# Patient Record
Sex: Male | Born: 1960
Health system: Southern US, Community
[De-identification: ages and names within clinical notes are randomized; demographics above are authoritative.]

## PROBLEM LIST (undated history)

## (undated) DIAGNOSIS — E7800 Pure hypercholesterolemia, unspecified: Secondary | ICD-10-CM

## (undated) DIAGNOSIS — I10 Essential (primary) hypertension: Secondary | ICD-10-CM

## (undated) HISTORY — DX: Pure hypercholesterolemia, unspecified: E78.00

---

## 2005-08-14 ENCOUNTER — Ambulatory Visit (HOSPITAL_COMMUNITY): Admission: RE | Admit: 2005-08-14 | Discharge: 2005-08-14 | Payer: Self-pay | Admitting: Family Medicine

## 2008-03-19 HISTORY — PX: COLONOSCOPY: SHX174

## 2008-09-27 ENCOUNTER — Encounter (INDEPENDENT_AMBULATORY_CARE_PROVIDER_SITE_OTHER): Payer: Self-pay | Admitting: *Deleted

## 2008-11-17 ENCOUNTER — Ambulatory Visit: Payer: Self-pay | Admitting: Internal Medicine

## 2008-11-17 DIAGNOSIS — K921 Melena: Secondary | ICD-10-CM | POA: Insufficient documentation

## 2008-11-17 DIAGNOSIS — K219 Gastro-esophageal reflux disease without esophagitis: Secondary | ICD-10-CM | POA: Insufficient documentation

## 2008-11-18 ENCOUNTER — Encounter: Payer: Self-pay | Admitting: Internal Medicine

## 2008-11-19 ENCOUNTER — Encounter: Payer: Self-pay | Admitting: Internal Medicine

## 2008-12-03 ENCOUNTER — Encounter: Payer: Self-pay | Admitting: Internal Medicine

## 2008-12-03 ENCOUNTER — Ambulatory Visit: Payer: Self-pay | Admitting: Internal Medicine

## 2008-12-03 ENCOUNTER — Ambulatory Visit (HOSPITAL_COMMUNITY): Admission: RE | Admit: 2008-12-03 | Discharge: 2008-12-03 | Payer: Self-pay | Admitting: Internal Medicine

## 2008-12-07 ENCOUNTER — Encounter: Payer: Self-pay | Admitting: Internal Medicine

## 2010-06-23 LAB — CBC
HCT: 42.9 % (ref 39.0–52.0)
MCV: 99.6 fL (ref 78.0–100.0)
RBC: 4.31 MIL/uL (ref 4.22–5.81)
WBC: 6.8 10*3/uL (ref 4.0–10.5)

## 2010-06-23 LAB — DIFFERENTIAL
Eosinophils Absolute: 0.2 10*3/uL (ref 0.0–0.7)
Eosinophils Relative: 3 % (ref 0–5)
Lymphocytes Relative: 20 % (ref 12–46)
Lymphs Abs: 1.4 10*3/uL (ref 0.7–4.0)
Monocytes Relative: 8 % (ref 3–12)
Neutrophils Relative %: 69 % (ref 43–77)

## 2010-08-04 NOTE — Procedures (Signed)
NAME:  Kyle Lane, Kyle Lane          ACCOUNT NO.:  [id number]   MEDICAL RECORD NO.:  [id number]          PATIENT TYPE:  OUT   LOCATION:  [facility]                          FACILITY:  [facility]   PHYSICIAN:  Donna Bernard, M.D.DATE OF BIRTH:  01/25/1961   DATE OF PROCEDURE:  08/14/2005  DATE OF DISCHARGE:  08/14/2005                                    STRESS TEST   INDICATION FOR TEST:  This patient is a 50 year old African-American male  with recent atypical chest pains. Stress-test is done for risk  stratification purposes.   RESTING EKG:  Resting EKG reveals normal sinus rhythm with no significant  ST, T changes. The patient tolerated the first several stages well, in fact  he made it all the way up to the IV stage and reached maximum heart rate of  162.  At this point he had exceeded his sub-max predicted heart rate of 149.  At 0.08 seconds past the J-point there were no significant ST, T changes.  The patient stopped the test on the basis of fatigue.  The post-exercise EKG  was within normal limits.  The patient had hypertensive responses expected.   IMPRESSION:  Negative adequate stress-test.   PLAN:  The patient is encouraged to get back on a regular exercise program.      W. Simone Curia, M.D.  Electronically Signed     WSL/MEDQ  D:  09/14/2005  T:  09/14/2005  Job:  [id number]

## 2012-12-03 ENCOUNTER — Ambulatory Visit (INDEPENDENT_AMBULATORY_CARE_PROVIDER_SITE_OTHER): Payer: BC Managed Care – PPO | Admitting: Family Medicine

## 2012-12-03 ENCOUNTER — Encounter: Payer: Self-pay | Admitting: Family Medicine

## 2012-12-03 VITALS — BP 110/88 | Ht 65.0 in | Wt 187.1 lb

## 2012-12-03 DIAGNOSIS — I10 Essential (primary) hypertension: Secondary | ICD-10-CM

## 2012-12-03 DIAGNOSIS — E785 Hyperlipidemia, unspecified: Secondary | ICD-10-CM

## 2012-12-03 DIAGNOSIS — Z79899 Other long term (current) drug therapy: Secondary | ICD-10-CM

## 2012-12-03 DIAGNOSIS — R7301 Impaired fasting glucose: Secondary | ICD-10-CM

## 2012-12-03 MED ORDER — SIMVASTATIN 20 MG PO TABS: 20.0000 mg | ORAL_TABLET | Freq: Every evening | ORAL | Status: DC

## 2012-12-03 MED ORDER — MOEXIPRIL-HYDROCHLOROTHIAZIDE 15-12.5 MG PO TABS: ORAL_TABLET | ORAL | Status: DC

## 2012-12-03 NOTE — Progress Notes (Signed)
  Subjective:    Patient ID: Kyle Lane, male    DOB: [birth year], 52 y.o.   MRN: [id number]  Hypertension This is a chronic problem. The current episode started more than 1 year ago. The problem has been gradually improving since onset. The problem is controlled. There are no associated agents to hypertension. There are no known risk factors for coronary artery disease. The current treatment provides significant improvement. There are no compliance problems.   Patient states that he has no concerns today.  Needs to work on exercise, bowls once per wk Tingling sendstion Diet overall not too bad  Chol med faithful compliance, Review of Systems No chest pain no abdominal pain no change in bowel habits ROS otherwise negative    Objective:   Physical Exam  Alert hydration good. HEENT normal. Lungs clear. Heart regular rate and rhythm. Ankles without edema.      Assessment & Plan:  Impression #1 hypertension good control. #2 hyperlipidemia exact status uncertain. Plan diet exercise discussed. Appropriate blood work. Maintain same meds. Check every 6 months. WSL

## 2012-12-09 DIAGNOSIS — I10 Essential (primary) hypertension: Secondary | ICD-10-CM | POA: Insufficient documentation

## 2012-12-09 DIAGNOSIS — E785 Hyperlipidemia, unspecified: Secondary | ICD-10-CM | POA: Insufficient documentation

## 2012-12-09 LAB — LIPID PANEL
LDL Cholesterol: 107 mg/dL — ABNORMAL HIGH (ref 0–99)
Total CHOL/HDL Ratio: 3.1 Ratio
Triglycerides: 53 mg/dL (ref <150)
VLDL: 11 mg/dL (ref 0–40)

## 2012-12-09 LAB — HEPATIC FUNCTION PANEL
ALT: 26 U/L (ref 0–53)
Albumin: 4.5 g/dL (ref 3.5–5.2)
Indirect Bilirubin: 0.5 mg/dL (ref 0.0–0.9)
Total Protein: 7.4 g/dL (ref 6.0–8.3)

## 2012-12-09 LAB — GLUCOSE, RANDOM: Glucose, Bld: 96 mg/dL (ref 70–99)

## 2012-12-11 ENCOUNTER — Encounter: Payer: Self-pay | Admitting: Family Medicine

## 2013-07-03 ENCOUNTER — Ambulatory Visit: Payer: BC Managed Care – PPO | Admitting: Family Medicine

## 2013-07-06 ENCOUNTER — Ambulatory Visit (INDEPENDENT_AMBULATORY_CARE_PROVIDER_SITE_OTHER): Payer: BC Managed Care – PPO | Admitting: Family Medicine

## 2013-07-06 ENCOUNTER — Encounter: Payer: Self-pay | Admitting: Family Medicine

## 2013-07-06 VITALS — BP 154/98 | Ht 66.0 in | Wt 190.0 lb

## 2013-07-06 DIAGNOSIS — I10 Essential (primary) hypertension: Secondary | ICD-10-CM

## 2013-07-06 DIAGNOSIS — Z79899 Other long term (current) drug therapy: Secondary | ICD-10-CM

## 2013-07-06 DIAGNOSIS — Z125 Encounter for screening for malignant neoplasm of prostate: Secondary | ICD-10-CM

## 2013-07-06 DIAGNOSIS — E785 Hyperlipidemia, unspecified: Secondary | ICD-10-CM

## 2013-07-06 MED ORDER — MOEXIPRIL-HYDROCHLOROTHIAZIDE 15-12.5 MG PO TABS: ORAL_TABLET | ORAL | Status: DC

## 2013-07-06 MED ORDER — SIMVASTATIN 20 MG PO TABS: 20.0000 mg | ORAL_TABLET | Freq: Every evening | ORAL | Status: DC

## 2013-07-06 NOTE — Progress Notes (Signed)
   Subjective:    Patient ID: Kyle Lane, male    DOB: [birth year], 53 y.o.   MRN: [id number]  HPI Patient is here today for a 6 month check up.  Pt needs a refill on his meds.  He has no concerns.   Sticks with meds well  No obvious s e's, trying to watch the diet but not abad  Ran out of med Tuesday  Exercising some but not as much as he had hoped  Review of Systems No headache no chest pain no back pain no abdominal pain no change in bowel habits    Objective:   Physical Exam Alert no acute distress vitals stable blood pressure still elevated 1 [id number] lungs clear. Heart rare in rhythm.       Assessment & Plan:  Impression 1 hypertension suboptimal in control with recent noncompliance. #2 hyperlipidemia status uncertain plan appropriate blood work. Diet exercise discussed. Meds refilled. Recheck in 6 months. WSL

## 2013-07-24 LAB — BASIC METABOLIC PANEL
BUN: 16 mg/dL (ref 6–23)
CHLORIDE: 102 meq/L (ref 96–112)
CO2: 27 mEq/L (ref 19–32)
Calcium: 9.3 mg/dL (ref 8.4–10.5)
Creat: 0.92 mg/dL (ref 0.50–1.35)
Glucose, Bld: 103 mg/dL — ABNORMAL HIGH (ref 70–99)
POTASSIUM: 4.7 meq/L (ref 3.5–5.3)
SODIUM: 139 meq/L (ref 135–145)

## 2013-07-24 LAB — LIPID PANEL
Cholesterol: 178 mg/dL (ref 0–200)
HDL: 46 mg/dL (ref >39)
LDL CALC: 104 mg/dL — AB (ref 0–99)
Total CHOL/HDL Ratio: 3.9 Ratio
Triglycerides: 140 mg/dL (ref <150)
VLDL: 28 mg/dL (ref 0–40)

## 2013-07-24 LAB — HEPATIC FUNCTION PANEL
ALK PHOS: 69 U/L (ref 39–117)
ALT: 17 U/L (ref 0–53)
AST: 20 U/L (ref 0–37)
Albumin: 4.3 g/dL (ref 3.5–5.2)
BILIRUBIN INDIRECT: 0.7 mg/dL (ref 0.2–1.2)
Bilirubin, Direct: 0.1 mg/dL (ref 0.0–0.3)
TOTAL PROTEIN: 7.1 g/dL (ref 6.0–8.3)
Total Bilirubin: 0.8 mg/dL (ref 0.2–1.2)

## 2013-07-24 LAB — PSA: PSA: 0.47 ng/mL (ref <=4.00)

## 2013-08-02 ENCOUNTER — Encounter: Payer: Self-pay | Admitting: Family Medicine

## 2013-09-02 ENCOUNTER — Ambulatory Visit (INDEPENDENT_AMBULATORY_CARE_PROVIDER_SITE_OTHER): Payer: BC Managed Care – PPO | Admitting: Family Medicine

## 2013-09-02 ENCOUNTER — Encounter: Payer: Self-pay | Admitting: Family Medicine

## 2013-09-02 ENCOUNTER — Ambulatory Visit (HOSPITAL_COMMUNITY)
Admission: RE | Admit: 2013-09-02 | Discharge: 2013-09-02 | Disposition: A | Discharge status: Home / Self Care | Payer: Federal, State, Local not specified - PPO | Source: Ambulatory Visit | Attending: Family Medicine | Admitting: Family Medicine

## 2013-09-02 ENCOUNTER — Other Ambulatory Visit: Payer: Self-pay

## 2013-09-02 VITALS — BP 168/98 | Ht 66.0 in | Wt 186.0 lb

## 2013-09-02 DIAGNOSIS — M1990 Unspecified osteoarthritis, unspecified site: Secondary | ICD-10-CM

## 2013-09-02 DIAGNOSIS — M129 Arthropathy, unspecified: Secondary | ICD-10-CM

## 2013-09-02 DIAGNOSIS — M25532 Pain in left wrist: Secondary | ICD-10-CM

## 2013-09-02 DIAGNOSIS — M25539 Pain in unspecified wrist: Secondary | ICD-10-CM | POA: Insufficient documentation

## 2013-09-02 MED ORDER — HYDROCODONE-ACETAMINOPHEN 5-325 MG PO TABS: 1.0000 | ORAL_TABLET | Freq: Four times a day (QID) | ORAL | Status: DC | PRN

## 2013-09-02 MED ORDER — PREDNISONE 20 MG PO TABS: ORAL_TABLET | ORAL | Status: DC

## 2013-09-02 NOTE — Progress Notes (Signed)
   Subjective:    Patient ID: Kyle Lane, male    DOB: 01-19-1961, 53 y.o.   MRN: [id number]  Wrist Pain  The pain is present in the left wrist. This is a new problem. Episode onset: Orginally fractured it 20+ years. Started hurting again Monday. There has been a history of trauma. The problem occurs constantly. The problem has been gradually worsening. The quality of the pain is described as sharp. The pain is moderate. Associated symptoms include a limited range of motion. The symptoms are aggravated by activity. He has tried OTC ointments and cold for the symptoms. The treatment provided mild relief.   Pt had an xray done this morning.    Patient has history of remote fracture this area. Occasional late flares up with pain. Never this bad. Now active swelling. Review of Systems No fever no chills no major joint pain elsewhere ROS otherwise negative    Objective:   Physical Exam  Alert no apparent distress lungs clear heart regular in rhythm bile stable left wrist inflamed swollen  X-ray reviewed    Assessment & Plan:  Impression posttraumatic arthritis with apparent flare. Plan prednisone taper local measures discussed short wrist splint for now hydrocodone each bedtime. If continues to recur may need to see or the

## 2013-09-03 DIAGNOSIS — M1990 Unspecified osteoarthritis, unspecified site: Secondary | ICD-10-CM | POA: Insufficient documentation

## 2013-11-12 ENCOUNTER — Telehealth: Payer: Self-pay | Admitting: Family Medicine

## 2013-11-12 ENCOUNTER — Other Ambulatory Visit: Payer: Self-pay | Admitting: Family Medicine

## 2013-11-12 MED ORDER — MOEXIPRIL-HYDROCHLOROTHIAZIDE 15-12.5 MG PO TABS: ORAL_TABLET | ORAL | Status: DC

## 2013-11-12 NOTE — Telephone Encounter (Signed)
Refill sent to pharmacy. Patient was notified.  

## 2013-11-12 NOTE — Telephone Encounter (Signed)
Moexipril-Hydrochlorothiazide 15-12.5 MG TABS  Pt was to have 6 mo supply at 30 days each total 180 pills (end of April) Pt filled script for 90 of the days which left 90 pills or 3 mos left   At the end of July the pt filled for another 90 days but the pharmacy  [place] he only had 30 days supply left an only gave him 30 days supply  Pt should still have 60 days left but the pharmacy refuses to refill this for  The patient.    Can we fix this for the patient and get it sent in for him, he leaves in 2 days   Jose Persia

## 2013-11-17 ENCOUNTER — Telehealth: Payer: Self-pay | Admitting: *Deleted

## 2013-11-17 ENCOUNTER — Other Ambulatory Visit: Payer: Self-pay | Admitting: *Deleted

## 2013-11-17 MED ORDER — MOEXIPRIL HCL 15 MG PO TABS: 15.0000 mg | ORAL_TABLET | Freq: Every day | ORAL | Status: DC

## 2013-11-17 MED ORDER — HYDROCHLOROTHIAZIDE 12.5 MG PO TABS: 12.5000 mg | ORAL_TABLET | Freq: Every day | ORAL | Status: DC

## 2013-12-28 ENCOUNTER — Telehealth: Payer: Self-pay | Admitting: Family Medicine

## 2013-12-28 MED ORDER — MOEXIPRIL HCL 15 MG PO TABS: 15.0000 mg | ORAL_TABLET | Freq: Every day | ORAL | Status: DC

## 2013-12-28 NOTE — Addendum Note (Signed)
Addended by: Jesusita Oka on: 12/28/2013 03:16 PM   Modules accepted: Orders

## 2013-12-28 NOTE — Telephone Encounter (Signed)
Medication sent to pharmacy. Patient was notified.  

## 2013-12-28 NOTE — Telephone Encounter (Signed)
Wife called to get medication check for spouse who is completely out of his blood pressure medication. I gave him the first available apoointment for this was Nov. 4. He needs enough until then called into [facility] [place]. Wife didn't know the name.

## 2013-12-30 ENCOUNTER — Telehealth: Payer: Self-pay | Admitting: Family Medicine

## 2013-12-30 MED ORDER — HYDROCHLOROTHIAZIDE 12.5 MG PO TABS: 12.5000 mg | ORAL_TABLET | Freq: Every day | ORAL | Status: DC

## 2013-12-30 NOTE — Telephone Encounter (Signed)
HCTZ refill sent to pharmacy. Patient was notified.

## 2013-12-30 NOTE — Telephone Encounter (Signed)
hydrochlorothiazide (HYDRODIURIL) 12.5 MG tablet   Pt was supposed to get this med as well with his  moexipril (UNIVASC) 15 MG tablet when he was refilled on the  12th but it was not ordered. [facility]  See if they have this instock as a combo pill again so the pt can just  Take the one pill.  He can get a 30 day on the [facility], then next time the combo pill    [facility]

## 2014-01-20 ENCOUNTER — Ambulatory Visit (INDEPENDENT_AMBULATORY_CARE_PROVIDER_SITE_OTHER): Payer: BC Managed Care – PPO | Admitting: Family Medicine

## 2014-01-20 ENCOUNTER — Encounter: Payer: Self-pay | Admitting: Family Medicine

## 2014-01-20 VITALS — BP 138/92 | Ht 66.0 in | Wt 181.0 lb

## 2014-01-20 DIAGNOSIS — I10 Essential (primary) hypertension: Secondary | ICD-10-CM

## 2014-01-20 DIAGNOSIS — R7301 Impaired fasting glucose: Secondary | ICD-10-CM

## 2014-01-20 DIAGNOSIS — Z131 Encounter for screening for diabetes mellitus: Secondary | ICD-10-CM

## 2014-01-20 DIAGNOSIS — Z79899 Other long term (current) drug therapy: Secondary | ICD-10-CM

## 2014-01-20 DIAGNOSIS — E785 Hyperlipidemia, unspecified: Secondary | ICD-10-CM

## 2014-01-20 DIAGNOSIS — K219 Gastro-esophageal reflux disease without esophagitis: Secondary | ICD-10-CM

## 2014-01-20 MED ORDER — MOEXIPRIL-HYDROCHLOROTHIAZIDE 15-12.5 MG PO TABS: ORAL_TABLET | ORAL | Status: DC

## 2014-01-20 MED ORDER — SIMVASTATIN 20 MG PO TABS: 20.0000 mg | ORAL_TABLET | Freq: Every evening | ORAL | Status: DC

## 2014-01-20 NOTE — Progress Notes (Signed)
   Subjective:    Patient ID: Kyle Lane, male    DOB: 01-06-1961, 53 y.o.   MRN: [id number]  Hypertension This is a chronic problem. The problem is controlled.   Pt denies any HTN s/s  Needs refills on his meds.  bp elsewhere not cked. Compliant with medicine. Trying to watch salt intake. Not noticing any side effects from medication.  Compliant with lipid medicine. Diet fair in this regard. No obvious side effects from medication.  History of elevated sugar in the past on bloodwork.  Watching diet with limiting fried foods  occas hamburger etc.  Exercise not so hot, still bowls some  Reflux stable now, compliant with medication. Overall in good control.  Flu shot given  already    Review of Systems No headache no chest pain no back pain no change in bowel habits no blood in stool ROS otherwise negative    Objective:   Physical Exam Alert no apparent distress. HEENT normal. Lungs clear. Heart regular in rhythm. Ankles without edema.       Assessment & Plan:  Impression 1 hypertension good control #2 hyper per lipidemia status uncertain. #3 impaired fasting glucose status uncertain. #4 reflux clinically stable plan diet exercise discussed. Maintain same medications. Recheck every 6 months. Flu shot already given. WSL

## 2014-01-24 DIAGNOSIS — R7301 Impaired fasting glucose: Secondary | ICD-10-CM | POA: Insufficient documentation

## 2014-02-19 ENCOUNTER — Encounter: Payer: Self-pay | Admitting: Family Medicine

## 2014-02-19 LAB — LIPID PANEL
Cholesterol: 195 mg/dL (ref 0–200)
HDL: 52 mg/dL (ref >39)
LDL CALC: 123 mg/dL — AB (ref 0–99)
TRIGLYCERIDES: 100 mg/dL (ref <150)
Total CHOL/HDL Ratio: 3.8 Ratio
VLDL: 20 mg/dL (ref 0–40)

## 2014-02-19 LAB — HEPATIC FUNCTION PANEL
ALBUMIN: 4.2 g/dL (ref 3.5–5.2)
ALK PHOS: 66 U/L (ref 39–117)
ALT: 17 U/L (ref 0–53)
AST: 19 U/L (ref 0–37)
Bilirubin, Direct: 0.1 mg/dL (ref 0.0–0.3)
Indirect Bilirubin: 0.6 mg/dL (ref 0.2–1.2)
TOTAL PROTEIN: 6.9 g/dL (ref 6.0–8.3)
Total Bilirubin: 0.7 mg/dL (ref 0.2–1.2)

## 2014-02-19 LAB — GLUCOSE, RANDOM: Glucose, Bld: 108 mg/dL — ABNORMAL HIGH (ref 70–99)

## 2014-08-31 ENCOUNTER — Telehealth: Payer: Self-pay | Admitting: Family Medicine

## 2014-08-31 MED ORDER — MOEXIPRIL-HYDROCHLOROTHIAZIDE 15-12.5 MG PO TABS: ORAL_TABLET | ORAL | Status: DC

## 2014-08-31 MED ORDER — SIMVASTATIN 20 MG PO TABS: 20.0000 mg | ORAL_TABLET | Freq: Every evening | ORAL | Status: DC

## 2014-08-31 NOTE — Telephone Encounter (Signed)
Patient was notified that refills were sent to pharmacy and to keep appointment for Monday.

## 2014-08-31 NOTE — Telephone Encounter (Signed)
Pt is needing a refill on his blood pressure meds and his cholesterol meds. Pt has an appt on Mon for a med check.  [facility]

## 2014-09-06 ENCOUNTER — Ambulatory Visit (INDEPENDENT_AMBULATORY_CARE_PROVIDER_SITE_OTHER): Payer: Federal, State, Local not specified - PPO | Admitting: Family Medicine

## 2014-09-06 ENCOUNTER — Encounter: Payer: Self-pay | Admitting: Family Medicine

## 2014-09-06 VITALS — BP 130/90 | Ht 66.0 in | Wt 187.4 lb

## 2014-09-06 DIAGNOSIS — E785 Hyperlipidemia, unspecified: Secondary | ICD-10-CM | POA: Diagnosis not present

## 2014-09-06 DIAGNOSIS — Z79899 Other long term (current) drug therapy: Secondary | ICD-10-CM | POA: Diagnosis not present

## 2014-09-06 DIAGNOSIS — Z125 Encounter for screening for malignant neoplasm of prostate: Secondary | ICD-10-CM | POA: Diagnosis not present

## 2014-09-06 MED ORDER — SIMVASTATIN 20 MG PO TABS: 20.0000 mg | ORAL_TABLET | Freq: Every evening | ORAL | Status: DC

## 2014-09-06 MED ORDER — MOEXIPRIL-HYDROCHLOROTHIAZIDE 15-12.5 MG PO TABS: ORAL_TABLET | ORAL | Status: DC

## 2014-09-06 NOTE — Progress Notes (Signed)
   Subjective:    Patient ID: Kyle Lane, male    DOB: [birth year], 54 y.o.   MRN: [id number]  Hypertension This is a chronic problem. The current episode started more than 1 year ago. The problem has been gradually improving since onset. There are no associated agents to hypertension. There are no known risk factors for coronary artery disease. Treatments tried: Moexipril-HCTZ. The current treatment provides significant improvement. There are no compliance problems.    Patient states that he has no concerns at this time.   Not cking bp elsewhere, comp with meds. No side effects  Has cut down sugar tryng to watch   Has cut down salt but a challenge in th esummer  Does not miss meds   Compliant with lipid medications. Mostly watch and fats in diet. Does not miss a dose. No obvious side effects from medication.  Realizes he has glucose intolerance. Also work on cutting down sugar and diet.  Not exercising much these days       Review of Systems No headache no chest pain no back pain no abdominal pain no change in bowel habits    Objective:   Physical Exam  Alert vitals stable. Lungs clear. Heart rare rhythm H&T normal ankles without edema      Assessment & Plan:  Impression 1 hypertension good control #2 hyperlipidemia status uncertain #3 impaired fasting glucose status uncertain #4 appropriate prostate screening patient would like to do blood work discussed plan appropriate blood work. Diet exercise discussed. Medications refilled. Check every 6 months. WSL

## 2014-09-10 LAB — HEPATIC FUNCTION PANEL
ALK PHOS: 75 IU/L (ref 39–117)
ALT: 19 IU/L (ref 0–44)
AST: 18 IU/L (ref 0–40)
Albumin: 4.3 g/dL (ref 3.5–5.5)
BILIRUBIN TOTAL: 0.7 mg/dL (ref 0.0–1.2)
Bilirubin, Direct: 0.16 mg/dL (ref 0.00–0.40)
Total Protein: 7.1 g/dL (ref 6.0–8.5)

## 2014-09-10 LAB — LIPID PANEL
Chol/HDL Ratio: 3.7 ratio units (ref 0.0–5.0)
Cholesterol, Total: 200 mg/dL — ABNORMAL HIGH (ref 100–199)
HDL: 54 mg/dL (ref >39)
LDL Calculated: 123 mg/dL — ABNORMAL HIGH (ref 0–99)
TRIGLYCERIDES: 114 mg/dL (ref 0–149)
VLDL CHOLESTEROL CAL: 23 mg/dL (ref 5–40)

## 2014-09-10 LAB — BASIC METABOLIC PANEL
BUN/Creatinine Ratio: 9 (ref 9–20)
BUN: 10 mg/dL (ref 6–24)
CALCIUM: 9.7 mg/dL (ref 8.7–10.2)
CHLORIDE: 101 mmol/L (ref 97–108)
CO2: 26 mmol/L (ref 18–29)
CREATININE: 1.14 mg/dL (ref 0.76–1.27)
GFR calc Af Amer: 84 mL/min/{1.73_m2} (ref >59)
GFR calc non Af Amer: 73 mL/min/{1.73_m2} (ref >59)
Glucose: 112 mg/dL — ABNORMAL HIGH (ref 65–99)
POTASSIUM: 4.3 mmol/L (ref 3.5–5.2)
SODIUM: 140 mmol/L (ref 134–144)

## 2014-09-10 LAB — PSA: Prostate Specific Ag, Serum: 0.7 ng/mL (ref 0.0–4.0)

## 2014-09-13 ENCOUNTER — Encounter: Payer: Self-pay | Admitting: Family Medicine

## 2015-02-28 ENCOUNTER — Encounter: Payer: Self-pay | Admitting: Family Medicine

## 2015-02-28 ENCOUNTER — Ambulatory Visit (INDEPENDENT_AMBULATORY_CARE_PROVIDER_SITE_OTHER): Payer: Federal, State, Local not specified - PPO | Admitting: Family Medicine

## 2015-02-28 VITALS — BP 120/82 | Ht 66.0 in | Wt 180.0 lb

## 2015-02-28 DIAGNOSIS — E785 Hyperlipidemia, unspecified: Secondary | ICD-10-CM

## 2015-02-28 DIAGNOSIS — I10 Essential (primary) hypertension: Secondary | ICD-10-CM | POA: Diagnosis not present

## 2015-02-28 DIAGNOSIS — Z23 Encounter for immunization: Secondary | ICD-10-CM

## 2015-02-28 DIAGNOSIS — R7301 Impaired fasting glucose: Secondary | ICD-10-CM

## 2015-02-28 MED ORDER — SIMVASTATIN 20 MG PO TABS: 20.0000 mg | ORAL_TABLET | Freq: Every evening | ORAL | Status: DC

## 2015-02-28 MED ORDER — MOEXIPRIL-HYDROCHLOROTHIAZIDE 15-12.5 MG PO TABS: ORAL_TABLET | ORAL | Status: DC

## 2015-02-28 NOTE — Progress Notes (Signed)
   Subjective:    Patient ID: Kyle Lane, male    DOB: 29-Sep-1960, 54 y.o.   MRN: [id number] Patient arrives office with 3 distinct concerns.   Hypertension This is a chronic problem. The current episode started more than 1 year ago. Risk factors for coronary artery disease include dyslipidemia. Treatments tried: moexipril/hctz.   compliant with blood pressure medicine. Meds reviewed today. Watching salt intake. Exercising 2-3 times per week.  Compliant with lipid medicine. Does not miss a dose. No obvious side effects. Meds reviewed today. Blood work from last result discussed.   Patient very tender curious and concerned about new diagnosis of impaired fasting glucose. Long discussion held   Diet overall decent, still grills red meat        Review of Systems No headache no chest pain no back pain no abdominal pain no change about habits no weight loss no weight gain ROS otherwise negative    Objective:   Physical Exam Alert vitals stable. HEENT normal. Lungs clear. Heart regular in rhythm. Ankles without edema neuro intact       Assessment & Plan:  Impression hypertension good control meds reviewed maintain same #2 hyperlipidemia good control meds reviewed maintain same pending blood work results #3 impaired fasting glucose very long discussion held. Plan diet exercise discussed. Appropriate blood work. Meds refilled. 25 minutes spent most in discussion follow-up in 6 months WSL

## 2015-03-31 LAB — HEPATIC FUNCTION PANEL
ALBUMIN: 4.9 g/dL (ref 3.5–5.5)
ALT: 24 IU/L (ref 0–44)
AST: 22 IU/L (ref 0–40)
Alkaline Phosphatase: 90 IU/L (ref 39–117)
BILIRUBIN TOTAL: 0.4 mg/dL (ref 0.0–1.2)
Bilirubin, Direct: 0.11 mg/dL (ref 0.00–0.40)
TOTAL PROTEIN: 7.8 g/dL (ref 6.0–8.5)

## 2015-03-31 LAB — LIPID PANEL
CHOLESTEROL TOTAL: 223 mg/dL — AB (ref 100–199)
Chol/HDL Ratio: 3.7 ratio units (ref 0.0–5.0)
HDL: 61 mg/dL (ref >39)
LDL CALC: 133 mg/dL — AB (ref 0–99)
Triglycerides: 143 mg/dL (ref 0–149)
VLDL CHOLESTEROL CAL: 29 mg/dL (ref 5–40)

## 2015-03-31 LAB — GLUCOSE, RANDOM: Glucose: 98 mg/dL (ref 65–99)

## 2015-04-03 ENCOUNTER — Encounter: Payer: Self-pay | Admitting: Family Medicine

## 2015-08-29 ENCOUNTER — Ambulatory Visit: Payer: Federal, State, Local not specified - PPO | Admitting: Family Medicine

## 2015-08-29 ENCOUNTER — Encounter: Payer: Self-pay | Admitting: Family Medicine

## 2015-09-23 ENCOUNTER — Other Ambulatory Visit: Payer: Self-pay | Admitting: Family Medicine

## 2015-10-23 ENCOUNTER — Other Ambulatory Visit: Payer: Self-pay | Admitting: Family Medicine

## 2015-12-13 ENCOUNTER — Telehealth: Payer: Self-pay | Admitting: Family Medicine

## 2015-12-13 MED ORDER — MOEXIPRIL-HYDROCHLOROTHIAZIDE 15-12.5 MG PO TABS: 1.0000 | ORAL_TABLET | Freq: Every day | ORAL | 0 refills | Status: DC

## 2015-12-13 MED ORDER — SIMVASTATIN 20 MG PO TABS: ORAL_TABLET | ORAL | 0 refills | Status: DC

## 2015-12-13 NOTE — Telephone Encounter (Signed)
Patient is out of the following medication and would like a refill to last until his appointment 01/02/16.   Moexipril-Hydrochlorothiazide 15-12.5 MG TABS  simvastatin (ZOCOR) 20 MG tablet  [facility]

## 2015-12-13 NOTE — Telephone Encounter (Signed)
Prescriptions sent electronically to pharmacy. Patient notified. °

## 2016-01-02 ENCOUNTER — Ambulatory Visit (INDEPENDENT_AMBULATORY_CARE_PROVIDER_SITE_OTHER): Payer: Federal, State, Local not specified - PPO | Admitting: Family Medicine

## 2016-01-02 ENCOUNTER — Encounter: Payer: Self-pay | Admitting: Family Medicine

## 2016-01-02 VITALS — BP 142/90 | Ht 66.0 in | Wt 185.4 lb

## 2016-01-02 DIAGNOSIS — Z23 Encounter for immunization: Secondary | ICD-10-CM | POA: Diagnosis not present

## 2016-01-02 DIAGNOSIS — I10 Essential (primary) hypertension: Secondary | ICD-10-CM | POA: Diagnosis not present

## 2016-01-02 DIAGNOSIS — R7303 Prediabetes: Secondary | ICD-10-CM | POA: Diagnosis not present

## 2016-01-02 DIAGNOSIS — Z125 Encounter for screening for malignant neoplasm of prostate: Secondary | ICD-10-CM | POA: Diagnosis not present

## 2016-01-02 DIAGNOSIS — K219 Gastro-esophageal reflux disease without esophagitis: Secondary | ICD-10-CM

## 2016-01-02 DIAGNOSIS — E785 Hyperlipidemia, unspecified: Secondary | ICD-10-CM | POA: Diagnosis not present

## 2016-01-02 DIAGNOSIS — R7301 Impaired fasting glucose: Secondary | ICD-10-CM

## 2016-01-02 MED ORDER — SIMVASTATIN 20 MG PO TABS: ORAL_TABLET | ORAL | 0 refills | Status: DC

## 2016-01-02 MED ORDER — MOEXIPRIL-HYDROCHLOROTHIAZIDE 15-12.5 MG PO TABS: 1.0000 | ORAL_TABLET | Freq: Every day | ORAL | 5 refills | Status: DC

## 2016-01-02 NOTE — Progress Notes (Signed)
   Subjective:    Patient ID: Kyle Lane, male    DOB: 12-25-1960, 55 y.o.   MRN: [id number]  Hyperlipidemia  This is a chronic problem. The current episode started more than 1 year ago. Treatments tried: zocor. Risk factors for coronary artery disease include dyslipidemia and hypertension.   Blood pressure medicine and blood pressure levels reviewed today with patient. Compliant with blood pressure medicine. States does not miss a dose. No obvious side effects. Blood pressure generally good when checked elsewhere. Watching salt intake.  Patient continues to take lipid medication regularly. No obvious side effects from it. Generally does not miss a dose. Prior blood work results are reviewed with patient. Patient continues to work on fat intake in diet  Does nt ck b p elsewhere  Eating well  Patient has known prediabetes. There is family history of diabetes. Patient trying to watch sugar intake. Not exercising as much as he hoped. No symptoms of hyperglycemia  Not excising very much    Review of Systems No headache, no major weight loss or weight gain, no chest pain no back pain abdominal pain no change in bowel habits complete ROS otherwise negative     Objective:   Physical Exam  Alert vitals stable, NAD. Blood pressure good on repeat. HEENT normal. Lungs clear. Heart regular rate and rhythm. Ankles without edema      Assessment & Plan:  Impression 1 hypertension good control discussed maintain same dose of meds. #2 hyperlipidemia status uncertain discuss. Prior blood work reviewed. Patient maintain same dose pending results #3 prediabetes long discussion held. Patient's halfway there with the fasting sugar year ago of 112. Discuss patient to work on plan medications refilled. Appropriate blood work diet exercise discussed flu shot WSL

## 2016-01-10 DIAGNOSIS — E785 Hyperlipidemia, unspecified: Secondary | ICD-10-CM | POA: Diagnosis not present

## 2016-01-10 DIAGNOSIS — I10 Essential (primary) hypertension: Secondary | ICD-10-CM | POA: Diagnosis not present

## 2016-01-10 DIAGNOSIS — R7301 Impaired fasting glucose: Secondary | ICD-10-CM | POA: Diagnosis not present

## 2016-01-10 DIAGNOSIS — Z125 Encounter for screening for malignant neoplasm of prostate: Secondary | ICD-10-CM | POA: Diagnosis not present

## 2016-01-11 LAB — BASIC METABOLIC PANEL
BUN/Creatinine Ratio: 15 (ref 9–20)
BUN: 15 mg/dL (ref 6–24)
CALCIUM: 9.2 mg/dL (ref 8.7–10.2)
CHLORIDE: 104 mmol/L (ref 96–106)
CO2: 26 mmol/L (ref 18–29)
Creatinine, Ser: 1.02 mg/dL (ref 0.76–1.27)
GFR calc Af Amer: 95 mL/min/{1.73_m2} (ref >59)
GFR calc non Af Amer: 82 mL/min/{1.73_m2} (ref >59)
GLUCOSE: 95 mg/dL (ref 65–99)
POTASSIUM: 5 mmol/L (ref 3.5–5.2)
SODIUM: 144 mmol/L (ref 134–144)

## 2016-01-11 LAB — HEPATIC FUNCTION PANEL
ALBUMIN: 4.3 g/dL (ref 3.5–5.5)
ALK PHOS: 79 IU/L (ref 39–117)
ALT: 19 IU/L (ref 0–44)
AST: 20 IU/L (ref 0–40)
Bilirubin Total: 0.4 mg/dL (ref 0.0–1.2)
Bilirubin, Direct: 0.12 mg/dL (ref 0.00–0.40)
Total Protein: 7.1 g/dL (ref 6.0–8.5)

## 2016-01-11 LAB — PSA: PROSTATE SPECIFIC AG, SERUM: 0.5 ng/mL (ref 0.0–4.0)

## 2016-01-11 LAB — LIPID PANEL
CHOLESTEROL TOTAL: 220 mg/dL — AB (ref 100–199)
Chol/HDL Ratio: 5 ratio units (ref 0.0–5.0)
HDL: 44 mg/dL (ref >39)
LDL CALC: 157 mg/dL — AB (ref 0–99)
Triglycerides: 96 mg/dL (ref 0–149)
VLDL CHOLESTEROL CAL: 19 mg/dL (ref 5–40)

## 2016-01-24 IMAGING — CR DG WRIST COMPLETE 3+V*L*
2 series · 2 of 2 positions shown · non-contrast
Comparison: None.

CLINICAL DATA: left wrist pain

EXAM:
LEFT WRIST - COMPLETE 3+ VIEW

[view not recorded (1 of 2)]
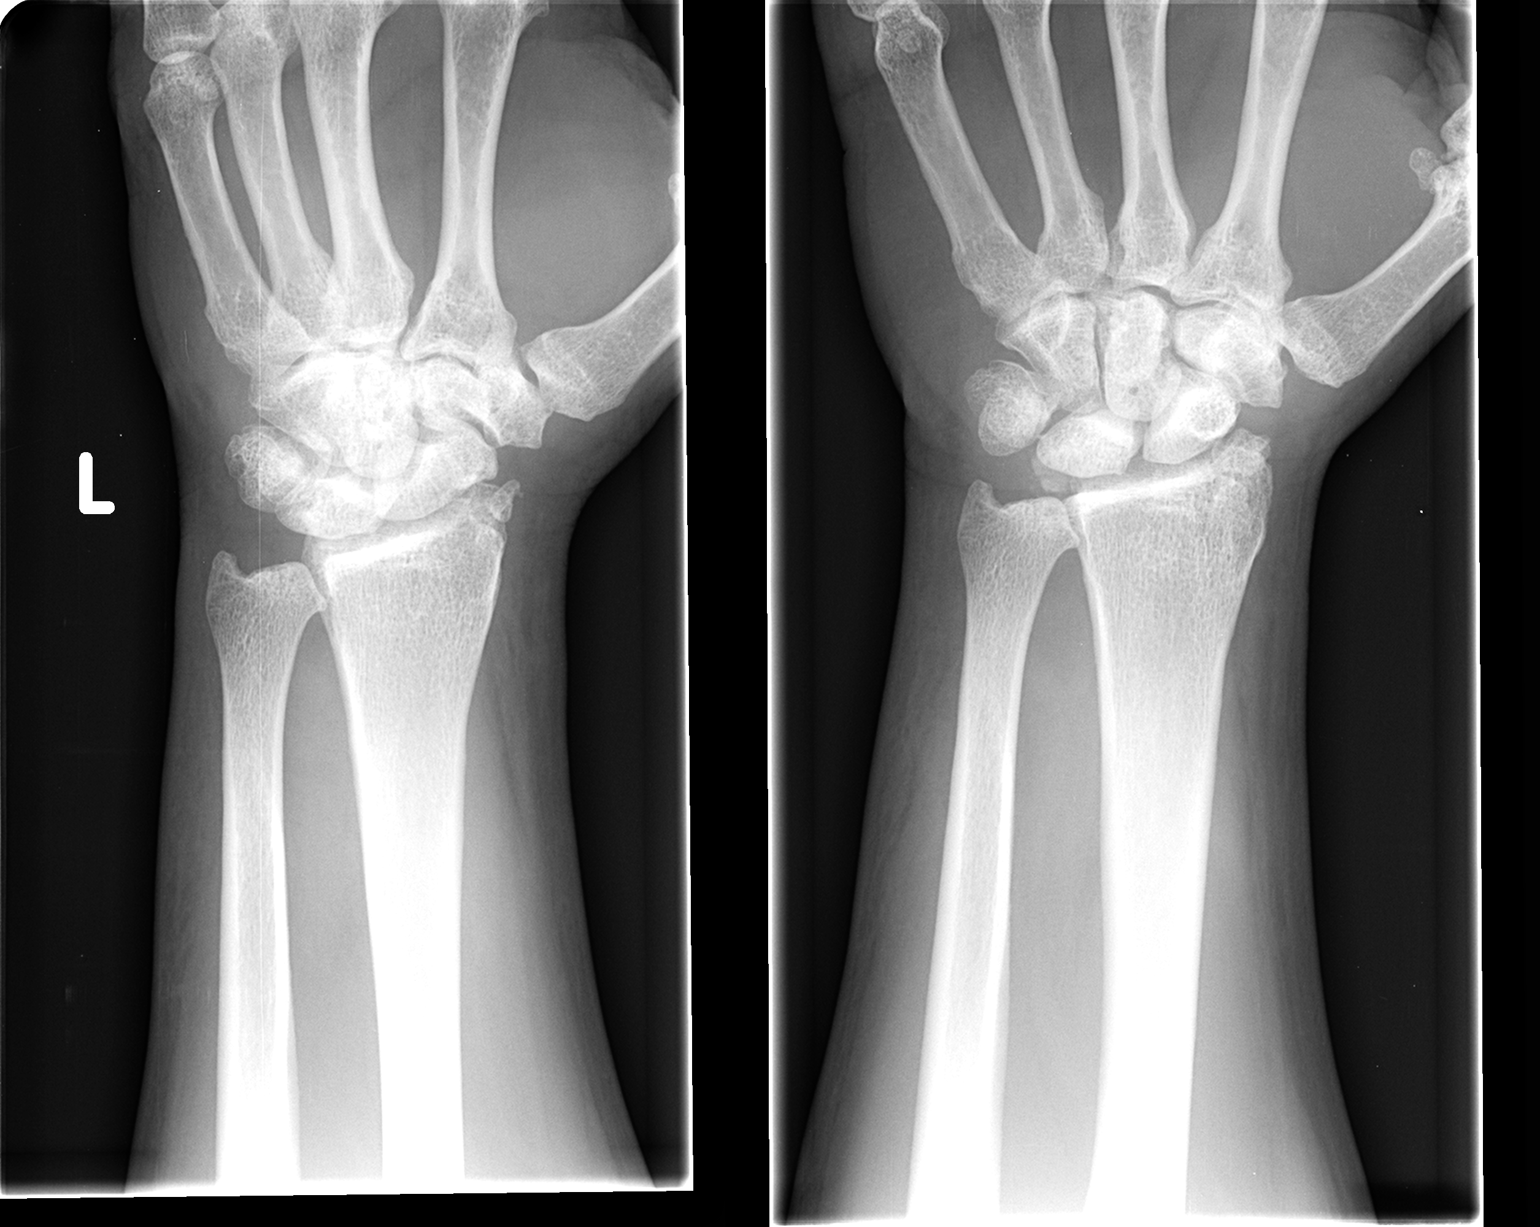

[view not recorded (2 of 2)]
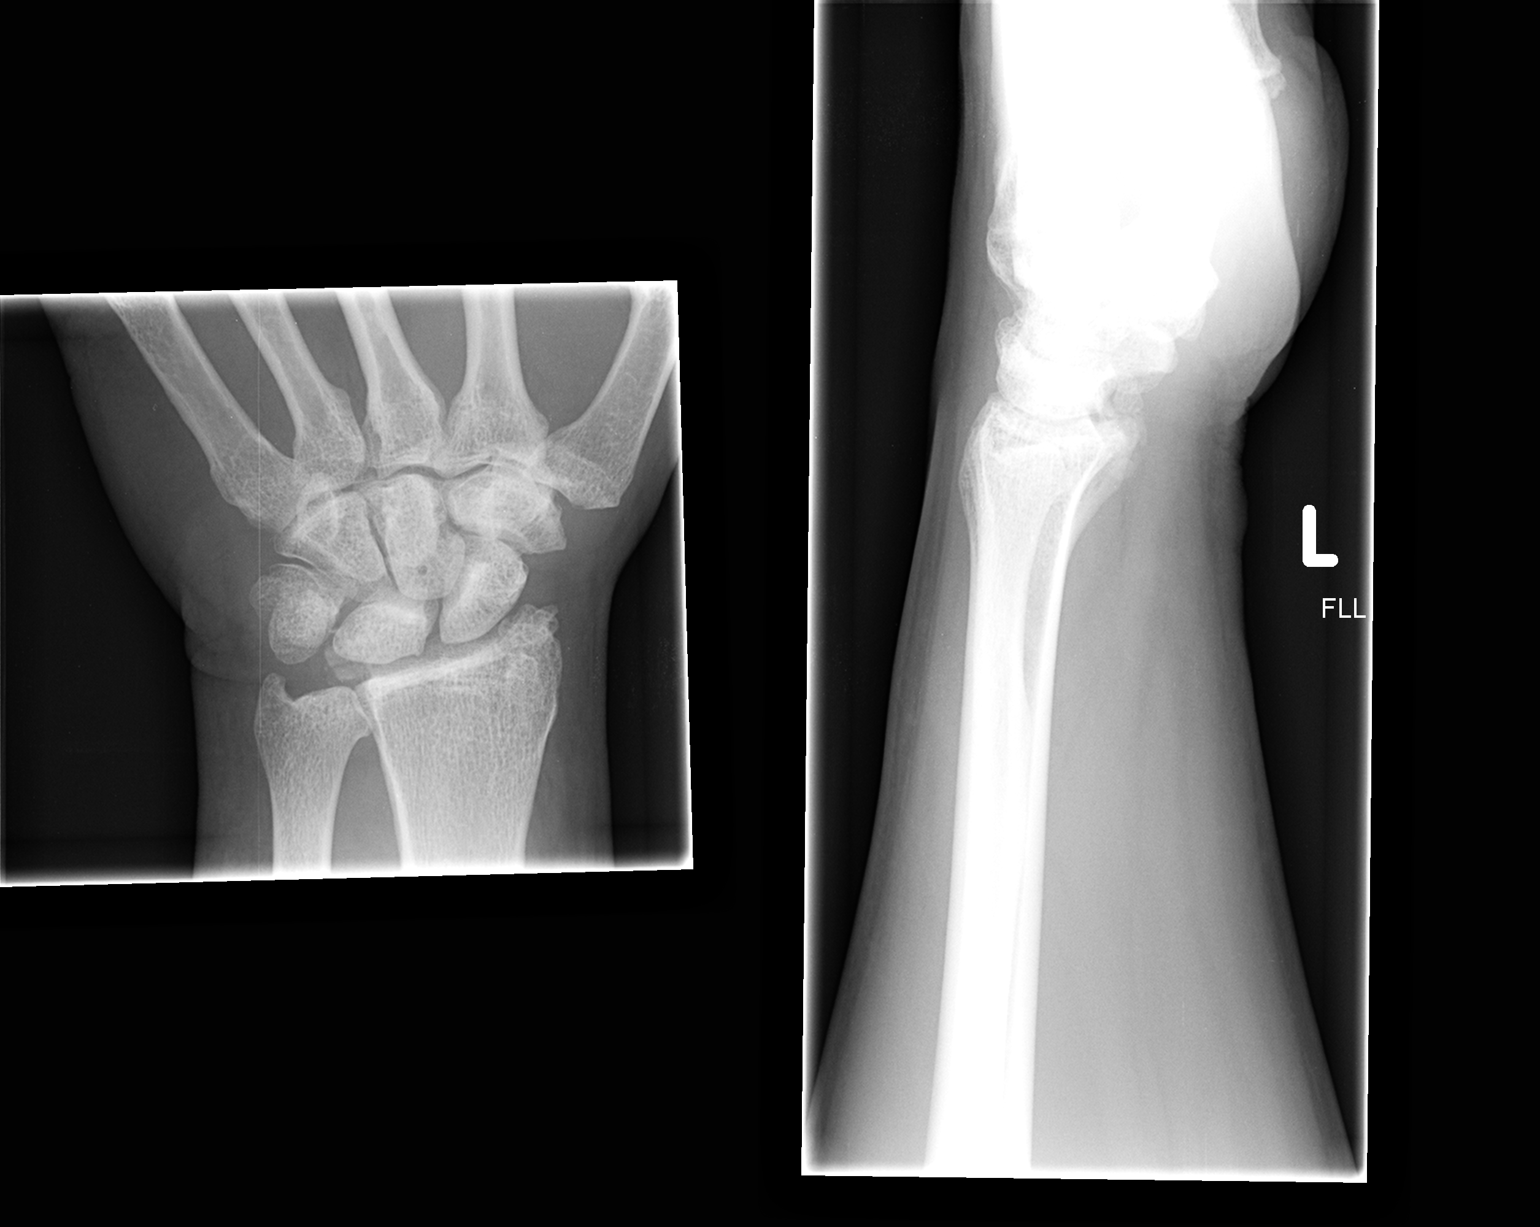

[2 of 2 positions shown; findings below may reference images not displayed]

FINDINGS: No acute fracture or dislocation. Findings consistent with prior
radial styloid fracture. Osteoarthritic changes appreciated within
the metacarpal carpal articulation, intercarpal joints, and
radiocarpal joint.
IMPRESSION: Osteoarthritic changes. No acute osseous abnormalities. Chronic
changes consistent with patient's history of fracture.

## 2016-01-31 ENCOUNTER — Other Ambulatory Visit: Payer: Self-pay | Admitting: Family Medicine

## 2016-05-01 ENCOUNTER — Telehealth: Payer: Self-pay | Admitting: Family Medicine

## 2016-05-01 NOTE — Telephone Encounter (Signed)
Pt had a deep cleaning on his teeth and is having some pain. Pt is wanting to know what he can take since he is on blood pressure and cholesterol medication. Please advise.

## 2016-05-01 NOTE — Telephone Encounter (Signed)
Wireless customer not available

## 2016-05-01 NOTE — Telephone Encounter (Signed)
Tylenol fine, unless dentist says otherwise ibuprofen three 200 mg tabs three times per d woukd be better

## 2016-05-03 NOTE — Telephone Encounter (Signed)
Line busy

## 2016-05-04 NOTE — Telephone Encounter (Signed)
Telephone call no answer 

## 2016-07-02 ENCOUNTER — Encounter: Payer: Self-pay | Admitting: Family Medicine

## 2016-07-02 ENCOUNTER — Ambulatory Visit (INDEPENDENT_AMBULATORY_CARE_PROVIDER_SITE_OTHER): Payer: Federal, State, Local not specified - PPO | Admitting: Family Medicine

## 2016-07-02 VITALS — BP 142/94 | Ht 65.5 in | Wt 183.0 lb

## 2016-07-02 DIAGNOSIS — Z79899 Other long term (current) drug therapy: Secondary | ICD-10-CM | POA: Diagnosis not present

## 2016-07-02 DIAGNOSIS — E785 Hyperlipidemia, unspecified: Secondary | ICD-10-CM | POA: Diagnosis not present

## 2016-07-02 DIAGNOSIS — I10 Essential (primary) hypertension: Secondary | ICD-10-CM | POA: Diagnosis not present

## 2016-07-02 DIAGNOSIS — Z0000 Encounter for general adult medical examination without abnormal findings: Secondary | ICD-10-CM | POA: Diagnosis not present

## 2016-07-02 NOTE — Progress Notes (Signed)
Subjective:    Patient ID: Kyle Lane, male    DOB: Jun 23, 1960, 56 y.o.   MRN: [id number]  HPI The patient comes in today for a wellness visit.    A review of their health history was completed.  A review of medications was also completed.  Any needed refills; none today  Eating habits: health conscious  Falls/  MVA accidents in past few months: none  Regular exercise: none  Specialist pt sees on regular basis: none  Preventative health issues were discussed.   Additional concerns: med check up.   Blood pressure medicine and blood pressure levels reviewed today with patient. Compliant with blood pressure medicine. States does not miss a dose. No obvious side effects. Blood pressure generally good when checked elsewhere. Watching salt intake.  Patient continues to take lipid medication regularly. No obvious side effects from it. Generally does not miss a dose. Prior blood work results are reviewed with patient. Patient continues to work on fat intake in diet  occas forgets meds, but not often  Not so good on exercise, occas walking but not a lot  Colon done in 2010 next one due in two yrs  utd on flu shot   No fam hx of prost ca or colonc ca  No doab on the family  No smoking                                                    +   Review of Systems  Constitutional: Negative for activity change, appetite change and fever.  HENT: Negative for congestion and rhinorrhea.   Eyes: Negative for discharge.  Respiratory: Negative for cough and wheezing.   Cardiovascular: Negative for chest pain.  Gastrointestinal: Negative for abdominal pain, blood in stool and vomiting.  Genitourinary: Negative for difficulty urinating and frequency.  Musculoskeletal: Negative for neck pain.  Skin: Negative for rash.  Allergic/Immunologic: Negative for environmental allergies and food allergies.  Neurological: Negative for  weakness and headaches.  Psychiatric/Behavioral: Negative for agitation.  All other systems reviewed and are negative.      Objective:   Physical Exam  Constitutional: He appears well-developed and well-nourished.  HENT:  Head: Normocephalic and atraumatic.  Right Ear: External ear normal.  Left Ear: External ear normal.  Nose: Nose normal.  Mouth/Throat: Oropharynx is clear and moist.  Eyes: EOM are normal. Pupils are equal, round, and reactive to light.  Neck: Normal range of motion. Neck supple. No thyromegaly present.  Cardiovascular: Normal rate, regular rhythm and normal heart sounds.   No murmur heard. Pulmonary/Chest: Effort normal and breath sounds normal. No respiratory distress. He has no wheezes.  Abdominal: Soft. Bowel sounds are normal. He exhibits no distension and no mass. There is no tenderness.  Genitourinary: Penis normal.  Genitourinary Comments: Prostate exam within normal limits  Musculoskeletal: Normal range of motion. He exhibits no edema.  Lymphadenopathy:    He has no cervical adenopathy.  Neurological: He is alert. He exhibits normal muscle tone.  Skin: Skin is warm and dry. No erythema.  Psychiatric: He has a normal mood and affect. His behavior is normal. Judgment normal.  Vitals reviewed.         Assessment & Plan:   WELLNESS EXAM. uP-TO-DATE ON COLONOSCOPY. dIET AND EXERCISE DISCUSSED. #2 HYPERTENSION. Good contro Discussed maintain same dose of  meds #3 hyperlipidemia goodiscussed [employer] exercise discussed. Medications refilled. Blood work ordered. Follow-up in 6 months

## 2016-07-11 DIAGNOSIS — E785 Hyperlipidemia, unspecified: Secondary | ICD-10-CM | POA: Diagnosis not present

## 2016-07-11 DIAGNOSIS — Z79899 Other long term (current) drug therapy: Secondary | ICD-10-CM | POA: Diagnosis not present

## 2016-07-12 LAB — LIPID PANEL
CHOLESTEROL TOTAL: 191 mg/dL (ref 100–199)
Chol/HDL Ratio: 3.2 ratio (ref 0.0–5.0)
HDL: 60 mg/dL (ref >39)
LDL Calculated: 114 mg/dL — ABNORMAL HIGH (ref 0–99)
Triglycerides: 85 mg/dL (ref 0–149)
VLDL CHOLESTEROL CAL: 17 mg/dL (ref 5–40)

## 2016-07-12 LAB — HEPATIC FUNCTION PANEL
ALK PHOS: 71 IU/L (ref 39–117)
ALT: 23 IU/L (ref 0–44)
AST: 27 IU/L (ref 0–40)
Albumin: 4.3 g/dL (ref 3.5–5.5)
Bilirubin Total: 0.4 mg/dL (ref 0.0–1.2)
Bilirubin, Direct: 0.1 mg/dL (ref 0.00–0.40)
Total Protein: 7.2 g/dL (ref 6.0–8.5)

## 2016-07-16 ENCOUNTER — Encounter: Payer: Self-pay | Admitting: Family Medicine

## 2016-09-06 ENCOUNTER — Other Ambulatory Visit: Payer: Self-pay | Admitting: Family Medicine

## 2016-10-03 ENCOUNTER — Other Ambulatory Visit: Payer: Self-pay | Admitting: Family Medicine

## 2016-12-21 ENCOUNTER — Other Ambulatory Visit: Payer: Self-pay | Admitting: Family Medicine

## 2017-01-01 ENCOUNTER — Ambulatory Visit (INDEPENDENT_AMBULATORY_CARE_PROVIDER_SITE_OTHER): Payer: Federal, State, Local not specified - PPO | Admitting: Family Medicine

## 2017-01-01 ENCOUNTER — Encounter: Payer: Self-pay | Admitting: Family Medicine

## 2017-01-01 VITALS — BP 118/74 | Ht 65.5 in | Wt 185.0 lb

## 2017-01-01 DIAGNOSIS — Z79899 Other long term (current) drug therapy: Secondary | ICD-10-CM | POA: Diagnosis not present

## 2017-01-01 DIAGNOSIS — Z23 Encounter for immunization: Secondary | ICD-10-CM | POA: Diagnosis not present

## 2017-01-01 DIAGNOSIS — Z125 Encounter for screening for malignant neoplasm of prostate: Secondary | ICD-10-CM | POA: Diagnosis not present

## 2017-01-01 DIAGNOSIS — E785 Hyperlipidemia, unspecified: Secondary | ICD-10-CM | POA: Diagnosis not present

## 2017-01-01 DIAGNOSIS — I10 Essential (primary) hypertension: Secondary | ICD-10-CM | POA: Diagnosis not present

## 2017-01-01 DIAGNOSIS — R7301 Impaired fasting glucose: Secondary | ICD-10-CM | POA: Diagnosis not present

## 2017-01-01 MED ORDER — MOEXIPRIL-HYDROCHLOROTHIAZIDE 15-12.5 MG PO TABS: 1.0000 | ORAL_TABLET | Freq: Every day | ORAL | 5 refills | Status: DC

## 2017-01-01 MED ORDER — SIMVASTATIN 20 MG PO TABS: 20.0000 mg | ORAL_TABLET | Freq: Every evening | ORAL | 5 refills | Status: DC

## 2017-01-01 NOTE — Progress Notes (Signed)
   Subjective:    Patient ID: Kyle Lane, male    DOB: 1960/10/04, 56 y.o.   MRN: [id number]  HPI Hyperlipemia. Takes simvastatin 20mg . Takes meds every day. Limits fried foods and salt intake. Not much exercise.  Fluid in right ear. Off and on for a month or so. Remembers no cold or allergy. Yawns hears a cracling or pop, full at times. No fever no chills no cough  Blood pressure medicine and blood pressure levels reviewed today with patient. Compliant with blood pressure medicine. States does not miss a dose. No obvious side effects. Blood pressure generally good when checked elsewhere. Watching salt intake.  Patient continues to take lipid medication regularly. No obvious side effects from it. Generally does not miss a dose. Prior blood work results are reviewed with patient. Patient continues to work on fat intake in diet  Pos hx of impaired fasting sugars, working on diet    No   cking b p these days, Generally good  Diet not very good in the siummer  Still eating fried foods and shellfish  Results for orders placed or performed in visit on 07/02/16  Lipid panel  Result Value Ref Range   Cholesterol, Total 191 100 - 199 mg/dL   Triglycerides 85 0 - 149 mg/dL   HDL 60 >39 mg/dL   VLDL Cholesterol Cal 17 5 - 40 mg/dL   LDL Calculated 114 (H) 0 - 99 mg/dL   Chol/HDL Ratio 3.2 0.0 - 5.0 ratio  Hepatic function panel  Result Value Ref Range   Total Protein 7.2 6.0 - 8.5 g/dL   Albumin 4.3 3.5 - 5.5 g/dL   Bilirubin Total 0.4 0.0 - 1.2 mg/dL   Bilirubin, Direct 0.10 0.00 - 0.40 mg/dL   Alkaline Phosphatase 71 39 - 117 IU/L   AST 27 0 - 40 IU/L   ALT 23 0 - 44 IU/L   Still bowling, not exdrcising much   Wants flu vaccine today.     Review of Systems No headache, no major weight loss or weight gain, no chest pain no back pain abdominal pain no change in bowel habits complete ROS otherwise negative     Objective:   Physical Exam Alert and oriented, vitals  reviewed and stable, NAD ENT-Retracted right TM's and ext canals WNL bilat via otoscopic exam Soft palate, tonsils and post pharynx WNL via oropharyngeal exam Neck-symmetric, no masses; thyroid nonpalpable and nontender Pulmonary-no tachypnea or accessory muscle use; Clear without wheezes via auscultation Card--no abnrml murmurs, rhythm reg and rate WNL Carotid pulses symmetric, without bruits        Assessment & Plan:  Impression 1 hypertension good control discussed maintain same medicine compliance discussed  #2 hyperlipidemia status uncertain prior blood work reviewed to maintain same pending  #3 impaired fasting glucose status uncertain will check  #4 right TM dysfunction with effusion discussed. No need for meds for antibiotics not indicated

## 2017-01-08 DIAGNOSIS — I10 Essential (primary) hypertension: Secondary | ICD-10-CM | POA: Diagnosis not present

## 2017-01-08 DIAGNOSIS — Z23 Encounter for immunization: Secondary | ICD-10-CM | POA: Diagnosis not present

## 2017-01-29 DIAGNOSIS — Z125 Encounter for screening for malignant neoplasm of prostate: Secondary | ICD-10-CM | POA: Diagnosis not present

## 2017-01-29 DIAGNOSIS — E785 Hyperlipidemia, unspecified: Secondary | ICD-10-CM | POA: Diagnosis not present

## 2017-01-29 DIAGNOSIS — Z79899 Other long term (current) drug therapy: Secondary | ICD-10-CM | POA: Diagnosis not present

## 2017-01-30 LAB — BASIC METABOLIC PANEL
BUN / CREAT RATIO: 21 — AB (ref 9–20)
BUN: 21 mg/dL (ref 6–24)
CO2: 24 mmol/L (ref 20–29)
CREATININE: 1.01 mg/dL (ref 0.76–1.27)
Calcium: 9.4 mg/dL (ref 8.7–10.2)
Chloride: 101 mmol/L (ref 96–106)
GFR calc non Af Amer: 82 mL/min/{1.73_m2} (ref >59)
GFR, EST AFRICAN AMERICAN: 95 mL/min/{1.73_m2} (ref >59)
Glucose: 109 mg/dL — ABNORMAL HIGH (ref 65–99)
Potassium: 4.9 mmol/L (ref 3.5–5.2)
Sodium: 142 mmol/L (ref 134–144)

## 2017-01-30 LAB — HEPATIC FUNCTION PANEL
ALT: 15 IU/L (ref 0–44)
AST: 16 IU/L (ref 0–40)
Albumin: 4.6 g/dL (ref 3.5–5.5)
Alkaline Phosphatase: 74 IU/L (ref 39–117)
BILIRUBIN TOTAL: 0.2 mg/dL (ref 0.0–1.2)
BILIRUBIN, DIRECT: 0.08 mg/dL (ref 0.00–0.40)
TOTAL PROTEIN: 7.1 g/dL (ref 6.0–8.5)

## 2017-01-30 LAB — PSA: PROSTATE SPECIFIC AG, SERUM: 0.4 ng/mL (ref 0.0–4.0)

## 2017-01-30 LAB — LIPID PANEL
CHOL/HDL RATIO: 3.6 ratio (ref 0.0–5.0)
Cholesterol, Total: 176 mg/dL (ref 100–199)
HDL: 49 mg/dL (ref >39)
LDL Calculated: 112 mg/dL — ABNORMAL HIGH (ref 0–99)
Triglycerides: 77 mg/dL (ref 0–149)
VLDL Cholesterol Cal: 15 mg/dL (ref 5–40)

## 2017-01-31 ENCOUNTER — Encounter: Payer: Self-pay | Admitting: Family Medicine

## 2017-06-05 ENCOUNTER — Telehealth: Payer: Self-pay | Admitting: Family Medicine

## 2017-06-05 ENCOUNTER — Other Ambulatory Visit: Payer: Self-pay | Admitting: *Deleted

## 2017-06-05 MED ORDER — LOSARTAN POTASSIUM-HCTZ 100-12.5 MG PO TABS: 1.0000 | ORAL_TABLET | Freq: Every day | ORAL | 2 refills | Status: DC

## 2017-06-05 NOTE — Telephone Encounter (Signed)
Pt was prescribed Moexipril-Hydrochlorothiazide 15-12.5 MG TABS Pt is unable to find a pharmacy with this available. Pt is going out of town and is needing his bp medicaption. Pt is needing to know if a different medication needs to be called in. Please advise.    Kyle Lane

## 2017-06-05 NOTE — Telephone Encounter (Signed)
Losartan 100/12.5 1 every morning, #30, 2 refills-this is a similar medicine it works in the same way.  It is in the same family as the current medicine he is using.(Please make sure the patient understands that this medication should control his blood pressure but he does need to keep all regular follow-up visits with Dr. Richardson Landry)

## 2017-06-05 NOTE — Telephone Encounter (Signed)
Discussed with pt. Pt verbalized understandingand Med sent to pharm.

## 2017-07-03 ENCOUNTER — Ambulatory Visit (INDEPENDENT_AMBULATORY_CARE_PROVIDER_SITE_OTHER): Payer: Federal, State, Local not specified - PPO | Admitting: Family Medicine

## 2017-07-03 ENCOUNTER — Encounter: Payer: Self-pay | Admitting: Family Medicine

## 2017-07-03 VITALS — BP 120/90 | Ht 65.5 in | Wt 185.0 lb

## 2017-07-03 DIAGNOSIS — Z125 Encounter for screening for malignant neoplasm of prostate: Secondary | ICD-10-CM | POA: Diagnosis not present

## 2017-07-03 DIAGNOSIS — I10 Essential (primary) hypertension: Secondary | ICD-10-CM | POA: Diagnosis not present

## 2017-07-03 DIAGNOSIS — Z0001 Encounter for general adult medical examination with abnormal findings: Secondary | ICD-10-CM | POA: Diagnosis not present

## 2017-07-03 DIAGNOSIS — Z79899 Other long term (current) drug therapy: Secondary | ICD-10-CM | POA: Diagnosis not present

## 2017-07-03 DIAGNOSIS — E785 Hyperlipidemia, unspecified: Secondary | ICD-10-CM | POA: Diagnosis not present

## 2017-07-03 DIAGNOSIS — M79676 Pain in unspecified toe(s): Secondary | ICD-10-CM | POA: Diagnosis not present

## 2017-07-03 DIAGNOSIS — Z0000 Encounter for general adult medical examination without abnormal findings: Secondary | ICD-10-CM

## 2017-07-03 MED ORDER — LOSARTAN POTASSIUM-HCTZ 100-12.5 MG PO TABS: 1.0000 | ORAL_TABLET | Freq: Every day | ORAL | 5 refills | Status: DC

## 2017-07-03 MED ORDER — SIMVASTATIN 20 MG PO TABS: 20.0000 mg | ORAL_TABLET | Freq: Every evening | ORAL | 5 refills | Status: DC

## 2017-07-03 NOTE — Progress Notes (Signed)
Subjective:    Patient ID: Kyle Lane, male    DOB: [birth year], 57 y.o.   MRN: [id number]  HPI The patient comes in today for a wellness visit.  Pt notes exercise not the best, bowls, and walks, foes to [employer]    A review of their health history was completed.  A review of medications was also completed.  Any needed refills; Yes  Eating habits: Not bad  Falls/  MVA accidents in past few months: No  Regular exercise: No  Specialist pt sees on regular basis: No  Preventative health issues were discussed.   Additional concerns: Changed bp Losartan w HCTZ 100-12.5 daily wants to discuss with you seems like causing him to eat more.  Blood pressure medicine and blood pressure levels reviewed today with patient. Compliant with blood pressure medicine. States does not miss a dose. No obvious side effects. Blood pressure generally good when checked elsewhere. Watching salt intake.   Patient continues to take lipid medication regularly. No obvious side effects from it. Generally does not miss a dose. Prior blood work results are reviewed with patient. Patient continues to work on fat intake in diet   BP may ck on occasion, numbers decent when cked lesewhere   Patient is also here today to follow up on chronic illnesses.  overal diet pretty good, eating more baked, fried fish n the like, not a bombardment, rotisserie chicken wtching salt     Review of Systems  Constitutional: Negative for activity change, appetite change and fever.  HENT: Negative for congestion and rhinorrhea.   Eyes: Negative for discharge.  Respiratory: Negative for cough and wheezing.   Cardiovascular: Negative for chest pain.  Gastrointestinal: Negative for abdominal pain, blood in stool and vomiting.  Genitourinary: Negative for difficulty urinating and frequency.  Musculoskeletal: Negative for neck pain.  Skin: Negative for rash.  Allergic/Immunologic: Negative for environmental  allergies and food allergies.  Neurological: Negative for weakness and headaches.  Psychiatric/Behavioral: Negative for agitation.  All other systems reviewed and are negative.      Objective:   Physical Exam  Constitutional: He appears well-developed and well-nourished.  HENT:  Head: Normocephalic and atraumatic.  Right Ear: External ear normal.  Left Ear: External ear normal.  Nose: Nose normal.  Mouth/Throat: Oropharynx is clear and moist.  Eyes: Right eye exhibits no discharge. Left eye exhibits no discharge. No scleral icterus.  Neck: Normal range of motion. Neck supple. No thyromegaly present.  Cardiovascular: Normal rate, regular rhythm and normal heart sounds.  No murmur heard. Pulmonary/Chest: Effort normal and breath sounds normal. No respiratory distress. He has no wheezes.  Abdominal: Soft. Bowel sounds are normal. He exhibits no distension and no mass. There is no tenderness.  Genitourinary: Penis normal.  Musculoskeletal: Normal range of motion. He exhibits no edema.  Lymphadenopathy:    He has no cervical adenopathy.  Neurological: He is alert. He exhibits normal muscle tone. Coordination normal.  Skin: Skin is warm and dry. No erythema.  Psychiatric: He has a normal mood and affect. His behavior is normal. Judgment normal.  Vitals reviewed.  Addendum to history patient noted painful right metatarsal phalangeal joint tenderness 2 days duration recalls no injury, exam shows mild inflammation.      Assessment & Plan:  Colon due sept 2010 #1 wellness exam.  Diet discussed.  Exercise discussed.  Appropriate blood work discussed.  Up-to-date on colonoscopy.  2.  Hypertension good control discussed maintain same meds  3.  Hyperlipidemia  status uncertain  4.  Early gout discussed will check uric acid  Await further blood work results and exercise discussed medication prescribed follow-up in 6 months

## 2017-09-04 DIAGNOSIS — Z125 Encounter for screening for malignant neoplasm of prostate: Secondary | ICD-10-CM | POA: Diagnosis not present

## 2017-09-04 DIAGNOSIS — E785 Hyperlipidemia, unspecified: Secondary | ICD-10-CM | POA: Diagnosis not present

## 2017-09-04 DIAGNOSIS — Z79899 Other long term (current) drug therapy: Secondary | ICD-10-CM | POA: Diagnosis not present

## 2017-09-04 DIAGNOSIS — M79676 Pain in unspecified toe(s): Secondary | ICD-10-CM | POA: Diagnosis not present

## 2017-09-05 LAB — BASIC METABOLIC PANEL
BUN / CREAT RATIO: 18 (ref 9–20)
BUN: 21 mg/dL (ref 6–24)
CALCIUM: 9.5 mg/dL (ref 8.7–10.2)
CHLORIDE: 104 mmol/L (ref 96–106)
CO2: 24 mmol/L (ref 20–29)
Creatinine, Ser: 1.18 mg/dL (ref 0.76–1.27)
GFR calc non Af Amer: 68 mL/min/{1.73_m2} (ref >59)
GFR, EST AFRICAN AMERICAN: 79 mL/min/{1.73_m2} (ref >59)
GLUCOSE: 115 mg/dL — AB (ref 65–99)
POTASSIUM: 4.7 mmol/L (ref 3.5–5.2)
Sodium: 142 mmol/L (ref 134–144)

## 2017-09-05 LAB — HEPATIC FUNCTION PANEL
ALT: 26 IU/L (ref 0–44)
AST: 22 IU/L (ref 0–40)
Albumin: 4.6 g/dL (ref 3.5–5.5)
Alkaline Phosphatase: 70 IU/L (ref 39–117)
Bilirubin Total: 0.5 mg/dL (ref 0.0–1.2)
Bilirubin, Direct: 0.14 mg/dL (ref 0.00–0.40)
Total Protein: 7.2 g/dL (ref 6.0–8.5)

## 2017-09-05 LAB — LIPID PANEL
CHOLESTEROL TOTAL: 203 mg/dL — AB (ref 100–199)
Chol/HDL Ratio: 4.1 ratio (ref 0.0–5.0)
HDL: 49 mg/dL (ref >39)
LDL CALC: 121 mg/dL — AB (ref 0–99)
TRIGLYCERIDES: 167 mg/dL — AB (ref 0–149)
VLDL CHOLESTEROL CAL: 33 mg/dL (ref 5–40)

## 2017-09-05 LAB — URIC ACID: Uric Acid: 7.2 mg/dL (ref 3.7–8.6)

## 2017-09-05 LAB — PSA: PROSTATE SPECIFIC AG, SERUM: 0.5 ng/mL (ref 0.0–4.0)

## 2017-09-08 ENCOUNTER — Encounter: Payer: Self-pay | Admitting: Family Medicine

## 2018-01-02 ENCOUNTER — Ambulatory Visit: Payer: Federal, State, Local not specified - PPO | Admitting: Family Medicine

## 2018-03-21 ENCOUNTER — Other Ambulatory Visit: Payer: Self-pay | Admitting: Family Medicine

## 2018-04-13 ENCOUNTER — Other Ambulatory Visit: Payer: Self-pay | Admitting: Family Medicine

## 2018-05-22 ENCOUNTER — Telehealth: Payer: Self-pay | Admitting: Family Medicine

## 2018-05-22 MED ORDER — LOSARTAN POTASSIUM-HCTZ 100-12.5 MG PO TABS: 1.0000 | ORAL_TABLET | Freq: Every day | ORAL | 0 refills | Status: DC

## 2018-05-22 NOTE — Telephone Encounter (Signed)
Prescription sent electronically to pharmacy. Patient notified. 

## 2018-05-22 NOTE — Telephone Encounter (Signed)
Pt has appt on 3/11 and is needing refills on Losartan and Hydrochlorothiazide refilled at  [facility], [place]   Pt's wife states he has been having headaches where he is out of the medication.   No DPR on file.   [facility] CB# [phone]

## 2018-05-27 ENCOUNTER — Ambulatory Visit: Payer: Federal, State, Local not specified - PPO | Admitting: Family Medicine

## 2018-05-28 ENCOUNTER — Encounter: Payer: Self-pay | Admitting: Family Medicine

## 2018-05-28 ENCOUNTER — Ambulatory Visit: Payer: Federal, State, Local not specified - PPO | Admitting: Family Medicine

## 2018-05-28 ENCOUNTER — Other Ambulatory Visit: Payer: Self-pay

## 2018-05-28 VITALS — BP 132/90 | Ht 65.5 in | Wt 189.0 lb

## 2018-05-28 DIAGNOSIS — E785 Hyperlipidemia, unspecified: Secondary | ICD-10-CM | POA: Diagnosis not present

## 2018-05-28 DIAGNOSIS — I10 Essential (primary) hypertension: Secondary | ICD-10-CM | POA: Diagnosis not present

## 2018-05-28 DIAGNOSIS — R7301 Impaired fasting glucose: Secondary | ICD-10-CM | POA: Diagnosis not present

## 2018-05-28 DIAGNOSIS — Z79899 Other long term (current) drug therapy: Secondary | ICD-10-CM | POA: Diagnosis not present

## 2018-05-28 MED ORDER — SIMVASTATIN 20 MG PO TABS: ORAL_TABLET | ORAL | 1 refills | Status: DC

## 2018-05-28 MED ORDER — LOSARTAN POTASSIUM-HCTZ 100-12.5 MG PO TABS: 1.0000 | ORAL_TABLET | Freq: Every day | ORAL | 1 refills | Status: DC

## 2018-05-28 NOTE — Progress Notes (Signed)
   Subjective:    Patient ID: Kyle Lane, male    DOB: 09-29-1960, 58 y.o.   MRN: [id number]  Hypertension  This is a chronic problem. Treatments tried: hyzaar. Compliance problems: takes meds every day, could do better with diet, dosen't have much time for exercise. works two jobs.    Pt states no concerns today.  Blood pressure medicine and blood pressure levels reviewed today with patient. Compliant with blood pressure medicine. States does not miss a dose. No obvious side effects. Blood pressure generally good when checked elsewhere. Watching salt intake.  Patient continues to take lipid medication regularly. No obvious side effects from it. Generally does not miss a dose. Prior blood work results are reviewed with patient. Patient continues to work on fat intake in diet  Prediabetes. Pt now workong on sugar in the diet moe aggresively. No known fam hx of diabetes   No more gout like attacks    Review of Systems No impression impression headache, no major weight loss or weight gain, no chest pain no back pain abdominal pain no change in bowel habits complete ROS otherwise negative     Objective:   Physical Exam  Alert and oriented, vitals reviewed and stable, NAD ENT-TM's and ext canals WNL bilat via otoscopic exam Soft palate, tonsils and post pharynx WNL via oropharyngeal exam Neck-symmetric, no masses; thyroid nonpalpable and nontender Pulmonary-no tachypnea or accessory muscle use; Clear without wheezes via auscultation Card--no abnrml murmurs, rhythm reg and rate WNL Carotid pulses symmetric, without bruits       Assessment & Plan:  Impression hypertension.  Good control discussed maintain same meds  2.  Hyperlipidemia current status uncertain prior blood work reviewed compliance discussed diet discussed  3.  Prediabetes.  Discussed.  Importance of control sugars rationale discussed will assess glucose  4.  Gout no recurrence thankfully  Further  recommendations based on blood work 6 months on meds wellness plus chronic

## 2018-11-27 ENCOUNTER — Encounter: Payer: Self-pay | Admitting: Internal Medicine

## 2018-11-28 ENCOUNTER — Encounter: Payer: Federal, State, Local not specified - PPO | Admitting: Family Medicine

## 2018-12-15 ENCOUNTER — Other Ambulatory Visit: Payer: Self-pay | Admitting: *Deleted

## 2018-12-15 ENCOUNTER — Telehealth: Payer: Self-pay | Admitting: Family Medicine

## 2018-12-15 MED ORDER — SIMVASTATIN 20 MG PO TABS: ORAL_TABLET | ORAL | 0 refills | Status: DC

## 2018-12-15 NOTE — Telephone Encounter (Signed)
He has an appt on Friday for his physical but has run out of cholesterol medicine already. Simvastatin 20 mg  [facility] on [address]

## 2018-12-15 NOTE — Telephone Encounter (Signed)
Refill sent to pharm. Freda Munro is not on release of information. Tried to call pt and no answer.

## 2018-12-16 NOTE — Telephone Encounter (Signed)
Pt.notified

## 2018-12-19 ENCOUNTER — Other Ambulatory Visit: Payer: Self-pay

## 2018-12-19 ENCOUNTER — Encounter: Payer: Self-pay | Admitting: Family Medicine

## 2018-12-19 ENCOUNTER — Ambulatory Visit (INDEPENDENT_AMBULATORY_CARE_PROVIDER_SITE_OTHER): Payer: Federal, State, Local not specified - PPO | Admitting: Family Medicine

## 2018-12-19 VITALS — Temp 97.5°F | Ht 65.5 in | Wt 177.7 lb

## 2018-12-19 DIAGNOSIS — I10 Essential (primary) hypertension: Secondary | ICD-10-CM | POA: Diagnosis not present

## 2018-12-19 DIAGNOSIS — Z23 Encounter for immunization: Secondary | ICD-10-CM | POA: Diagnosis not present

## 2018-12-19 DIAGNOSIS — Z79899 Other long term (current) drug therapy: Secondary | ICD-10-CM | POA: Diagnosis not present

## 2018-12-19 DIAGNOSIS — Z0000 Encounter for general adult medical examination without abnormal findings: Secondary | ICD-10-CM | POA: Diagnosis not present

## 2018-12-19 DIAGNOSIS — E785 Hyperlipidemia, unspecified: Secondary | ICD-10-CM | POA: Diagnosis not present

## 2018-12-19 DIAGNOSIS — Z125 Encounter for screening for malignant neoplasm of prostate: Secondary | ICD-10-CM

## 2018-12-19 MED ORDER — LOSARTAN POTASSIUM-HCTZ 100-12.5 MG PO TABS: 1.0000 | ORAL_TABLET | Freq: Every day | ORAL | 1 refills | Status: DC

## 2018-12-19 MED ORDER — SIMVASTATIN 20 MG PO TABS: ORAL_TABLET | ORAL | 1 refills | Status: DC

## 2018-12-19 NOTE — Progress Notes (Signed)
Subjective:    Patient ID: Kyle Lane, male    DOB: Jun 19, 1960, 58 y.o.   MRN: [id number]  HPI  The patient comes in today for a wellness visit.    A review of their health history was completed.  A review of medications was also completed.  Any needed refills;   Eating habits:decent not grrea  Falls/  MVA accidents in past few months: none  Regular exercise:not too good  Specialist pt sees on regular basis:none    Preventative health issues were discussed.   Additional concerns:  Blood pressure medicine and blood pressure levels reviewed today with patient. Compliant with blood pressure medicine. States does not miss a dose. No obvious side effects. Blood pressure generally good when checked elsewhere. Watching salt intake.  Patient continues to take lipid medication regularly. No obvious side effects from it. Generally does not miss a dose. Prior blood work results are reviewed with patient. Patient continues to work on fat intake in diet  Results for orders placed or performed in visit on 07/03/17  Lipid panel  Result Value Ref Range   Cholesterol, Total 203 (H) 100 - 199 mg/dL   Triglycerides 167 (H) 0 - 149 mg/dL   HDL 49 >39 mg/dL   VLDL Cholesterol Cal 33 5 - 40 mg/dL   LDL Calculated 121 (H) 0 - 99 mg/dL   Chol/HDL Ratio 4.1 0.0 - 5.0 ratio  Hepatic function panel  Result Value Ref Range   Total Protein 7.2 6.0 - 8.5 g/dL   Albumin 4.6 3.5 - 5.5 g/dL   Bilirubin Total 0.5 0.0 - 1.2 mg/dL   Bilirubin, Direct 0.14 0.00 - 0.40 mg/dL   Alkaline Phosphatase 70 39 - 117 IU/L   AST 22 0 - 40 IU/L   ALT 26 0 - 44 IU/L  Basic metabolic panel  Result Value Ref Range   Glucose 115 (H) 65 - 99 mg/dL   BUN 21 6 - 24 mg/dL   Creatinine, Ser 1.18 0.76 - 1.27 mg/dL   GFR calc non Af Amer 68 >59 mL/min/1.73   GFR calc Af Amer 79 >59 mL/min/1.73   BUN/Creatinine Ratio 18 9 - 20   Sodium 142 134 - 144 mmol/L   Potassium 4.7 3.5 - 5.2 mmol/L   Chloride 104  96 - 106 mmol/L   CO2 24 20 - 29 mmol/L   Calcium 9.5 8.7 - 10.2 mg/dL  PSA  Result Value Ref Range   Prostate Specific Ag, Serum 0.5 0.0 - 4.0 ng/mL  Uric acid  Result Value Ref Range   Uric Acid 7.2 3.7 - 8.6 mg/dL      Review of Systems  Constitutional: Negative for activity change, appetite change and fever.  HENT: Negative for congestion and rhinorrhea.   Eyes: Negative for discharge.  Respiratory: Negative for cough and wheezing.   Cardiovascular: Negative for chest pain.  Gastrointestinal: Negative for abdominal pain, blood in stool and vomiting.  Genitourinary: Negative for difficulty urinating and frequency.  Musculoskeletal: Negative for neck pain.  Skin: Negative for rash.  Allergic/Immunologic: Negative for environmental allergies and food allergies.  Neurological: Negative for weakness and headaches.  Psychiatric/Behavioral: Negative for agitation.  All other systems reviewed and are negative.      Objective:   Physical Exam Vitals signs reviewed.  Constitutional:      Appearance: He is well-developed.  HENT:     Head: Normocephalic and atraumatic.     Right Ear: External ear normal.  Left Ear: External ear normal.     Nose: Nose normal.  Eyes:     Pupils: Pupils are equal, round, and reactive to light.  Neck:     Musculoskeletal: Normal range of motion and neck supple.     Thyroid: No thyromegaly.  Cardiovascular:     Rate and Rhythm: Normal rate and regular rhythm.     Heart sounds: Normal heart sounds. No murmur.  Pulmonary:     Effort: Pulmonary effort is normal. No respiratory distress.     Breath sounds: Normal breath sounds. No wheezing.  Abdominal:     General: Bowel sounds are normal. There is no distension.     Palpations: Abdomen is soft. There is no mass.     Tenderness: There is no abdominal tenderness.  Genitourinary:    Penis: Normal.   Musculoskeletal: Normal range of motion.  Lymphadenopathy:     Cervical: No cervical  adenopathy.  Skin:    General: Skin is warm and dry.     Findings: No erythema.  Neurological:     Mental Status: He is alert.     Motor: No abnormal muscle tone.  Psychiatric:        Behavior: Behavior normal.        Judgment: Judgment normal.    Prostate exam within normal limits       Assessment & Plan:  Impression 1 wellness exam.  Diet discussed.  Exercise discussed.  Vaccines discussed and administered.  Appropriate blood work ordered  2.  Hypertension.  Good control discussed.  Compliance discussed medication refill salt intake discussed  3.  Hyperlipidemia status uncertain appropriate however would recommend  Flu shot  Follow-up in 6 months fasting

## 2018-12-20 ENCOUNTER — Encounter: Payer: Self-pay | Admitting: Family Medicine

## 2019-04-22 ENCOUNTER — Encounter: Payer: Self-pay | Admitting: Family Medicine

## 2019-06-22 ENCOUNTER — Other Ambulatory Visit: Payer: Self-pay

## 2019-06-22 ENCOUNTER — Ambulatory Visit (INDEPENDENT_AMBULATORY_CARE_PROVIDER_SITE_OTHER): Payer: Federal, State, Local not specified - PPO | Admitting: Family Medicine

## 2019-06-22 DIAGNOSIS — Z79899 Other long term (current) drug therapy: Secondary | ICD-10-CM | POA: Diagnosis not present

## 2019-06-22 DIAGNOSIS — E785 Hyperlipidemia, unspecified: Secondary | ICD-10-CM | POA: Diagnosis not present

## 2019-06-22 DIAGNOSIS — I10 Essential (primary) hypertension: Secondary | ICD-10-CM | POA: Diagnosis not present

## 2019-06-22 DIAGNOSIS — Z125 Encounter for screening for malignant neoplasm of prostate: Secondary | ICD-10-CM | POA: Diagnosis not present

## 2019-06-22 MED ORDER — SIMVASTATIN 20 MG PO TABS: ORAL_TABLET | ORAL | 1 refills | Status: DC

## 2019-06-22 MED ORDER — LOSARTAN POTASSIUM-HCTZ 100-12.5 MG PO TABS: 1.0000 | ORAL_TABLET | Freq: Every day | ORAL | 1 refills | Status: DC

## 2019-06-22 NOTE — Progress Notes (Signed)
   Subjective:  Audio only  Patient ID: Kyle Lane, male    DOB: [birth year], 59 y.o.   MRN: [id number]  Hyperlipidemia This is a chronic problem. The current episode started more than 1 year ago. The problem is controlled. Risk factors for coronary artery disease include hypertension.   No  Problems or concerns.  Virtual Visit via Video Note  I connected with Kyle Lane on 06/22/19 at  9:00 AM EDT by a video enabled telemedicine application and verified that I am speaking with the correct person using two identifiers.  Location: Patient: home Provider: office    I discussed the limitations of evaluation and management by telemedicine and the availability of in person appointments. The patient expressed understanding and agreed to proceed.  History of Present Illness:    Observations/Objective:   Assessment and Plan:   Follow Up Instructions:    I discussed the assessment and treatment plan with the patient. The patient was provided an opportunity to ask questions and all were answered. The patient agreed with the plan and demonstrated an understanding of the instructions.   The patient was advised to call back or seek an in-person evaluation if the symptoms worsen or if the condition fails to improve as anticipated.  21 minutes of non-face-to-face time during this encounter.  Blood pressure medicine and blood pressure levels reviewed today with patient. Compliant with blood pressure medicine. States does not miss a dose. No obvious side effects. Blood pressure generally good when checked elsewhere. Watching salt intake.   Patient continues to take lipid medication regularly. No obvious side effects from it. Generally does not miss a dose. Prior blood work results are reviewed with patient. Patient continues to work on fat intake in diet   Review of Systems No headache no chest pain no shortness of breath    Objective:   Physical Exam   Virtual    Assessment & Plan:  Impression 1 hypertension.  Good control discussed to maintain same meds diet exercise discussed.  Compliance discussed  2.  Hyperlipidemia.  Exact status uncertain.  Will check blood work.  Compliance discussed  Follow-up in 6 months for wellness plus chronic

## 2019-06-23 DIAGNOSIS — Z125 Encounter for screening for malignant neoplasm of prostate: Secondary | ICD-10-CM | POA: Diagnosis not present

## 2019-06-23 DIAGNOSIS — I10 Essential (primary) hypertension: Secondary | ICD-10-CM | POA: Diagnosis not present

## 2019-06-23 DIAGNOSIS — E785 Hyperlipidemia, unspecified: Secondary | ICD-10-CM | POA: Diagnosis not present

## 2019-06-23 DIAGNOSIS — Z79899 Other long term (current) drug therapy: Secondary | ICD-10-CM | POA: Diagnosis not present

## 2019-06-24 LAB — BASIC METABOLIC PANEL
BUN/Creatinine Ratio: 13 (ref 9–20)
BUN: 13 mg/dL (ref 6–24)
CO2: 23 mmol/L (ref 20–29)
Calcium: 9.5 mg/dL (ref 8.7–10.2)
Chloride: 105 mmol/L (ref 96–106)
Creatinine, Ser: 0.99 mg/dL (ref 0.76–1.27)
GFR calc Af Amer: 96 mL/min/{1.73_m2} (ref >59)
GFR calc non Af Amer: 83 mL/min/{1.73_m2} (ref >59)
Glucose: 116 mg/dL — ABNORMAL HIGH (ref 65–99)
Potassium: 4.5 mmol/L (ref 3.5–5.2)
Sodium: 142 mmol/L (ref 134–144)

## 2019-06-24 LAB — LIPID PANEL
Chol/HDL Ratio: 3.9 ratio (ref 0.0–5.0)
Cholesterol, Total: 171 mg/dL (ref 100–199)
HDL: 44 mg/dL (ref >39)
LDL Chol Calc (NIH): 113 mg/dL — ABNORMAL HIGH (ref 0–99)
Triglycerides: 75 mg/dL (ref 0–149)
VLDL Cholesterol Cal: 14 mg/dL (ref 5–40)

## 2019-06-24 LAB — HEPATIC FUNCTION PANEL
ALT: 18 IU/L (ref 0–44)
AST: 21 IU/L (ref 0–40)
Albumin: 4.2 g/dL (ref 3.8–4.9)
Alkaline Phosphatase: 80 IU/L (ref 39–117)
Bilirubin Total: 0.4 mg/dL (ref 0.0–1.2)
Bilirubin, Direct: 0.12 mg/dL (ref 0.00–0.40)
Total Protein: 6.9 g/dL (ref 6.0–8.5)

## 2019-06-24 LAB — PSA: Prostate Specific Ag, Serum: 0.5 ng/mL (ref 0.0–4.0)

## 2019-06-30 ENCOUNTER — Encounter: Payer: Self-pay | Admitting: Family Medicine

## 2019-07-27 ENCOUNTER — Other Ambulatory Visit: Payer: Self-pay | Admitting: Family Medicine

## 2019-10-22 ENCOUNTER — Emergency Department (HOSPITAL_COMMUNITY): Payer: Federal, State, Local not specified - PPO

## 2019-10-22 ENCOUNTER — Encounter (HOSPITAL_COMMUNITY): Payer: Self-pay | Admitting: *Deleted

## 2019-10-22 ENCOUNTER — Other Ambulatory Visit: Payer: Self-pay

## 2019-10-22 ENCOUNTER — Emergency Department (HOSPITAL_COMMUNITY)
Admission: EM | Admit: 2019-10-22 | Discharge: 2019-10-22 | Disposition: A | Discharge status: Home / Self Care | Payer: Federal, State, Local not specified - PPO | Attending: Emergency Medicine | Admitting: Emergency Medicine

## 2019-10-22 DIAGNOSIS — J9811 Atelectasis: Secondary | ICD-10-CM | POA: Diagnosis not present

## 2019-10-22 DIAGNOSIS — J069 Acute upper respiratory infection, unspecified: Secondary | ICD-10-CM | POA: Diagnosis not present

## 2019-10-22 DIAGNOSIS — R05 Cough: Secondary | ICD-10-CM | POA: Diagnosis not present

## 2019-10-22 DIAGNOSIS — U071 COVID-19: Secondary | ICD-10-CM | POA: Diagnosis not present

## 2019-10-22 DIAGNOSIS — Z79899 Other long term (current) drug therapy: Secondary | ICD-10-CM | POA: Diagnosis not present

## 2019-10-22 DIAGNOSIS — I10 Essential (primary) hypertension: Secondary | ICD-10-CM | POA: Diagnosis not present

## 2019-10-22 DIAGNOSIS — J811 Chronic pulmonary edema: Secondary | ICD-10-CM | POA: Diagnosis not present

## 2019-10-22 DIAGNOSIS — R000 Tachycardia, unspecified: Secondary | ICD-10-CM | POA: Diagnosis not present

## 2019-10-22 DIAGNOSIS — R059 Cough, unspecified: Secondary | ICD-10-CM

## 2019-10-22 HISTORY — DX: Essential (primary) hypertension: I10

## 2019-10-22 LAB — CBC WITH DIFFERENTIAL/PLATELET
Abs Immature Granulocytes: 0.01 10*3/uL (ref 0.00–0.07)
Basophils Absolute: 0 10*3/uL (ref 0.0–0.1)
Basophils Relative: 0 %
Eosinophils Absolute: 0 10*3/uL (ref 0.0–0.5)
Eosinophils Relative: 0 %
HCT: 46 % (ref 39.0–52.0)
Hemoglobin: 15.3 g/dL (ref 13.0–17.0)
Immature Granulocytes: 0 %
Lymphocytes Relative: 17 %
Lymphs Abs: 1.1 10*3/uL (ref 0.7–4.0)
MCH: 33.5 pg (ref 26.0–34.0)
MCHC: 33.3 g/dL (ref 30.0–36.0)
MCV: 100.7 fL — ABNORMAL HIGH (ref 80.0–100.0)
Monocytes Absolute: 0.7 10*3/uL (ref 0.1–1.0)
Monocytes Relative: 11 %
Neutro Abs: 4.9 10*3/uL (ref 1.7–7.7)
Neutrophils Relative %: 72 %
Platelets: 168 10*3/uL (ref 150–400)
RBC: 4.57 MIL/uL (ref 4.22–5.81)
RDW: 12.4 % (ref 11.5–15.5)
WBC: 6.8 10*3/uL (ref 4.0–10.5)
nRBC: 0 % (ref 0.0–0.2)

## 2019-10-22 LAB — COMPREHENSIVE METABOLIC PANEL
ALT: 19 U/L (ref 0–44)
AST: 30 U/L (ref 15–41)
Albumin: 4.2 g/dL (ref 3.5–5.0)
Alkaline Phosphatase: 62 U/L (ref 38–126)
Anion gap: 13 (ref 5–15)
BUN: 14 mg/dL (ref 6–20)
CO2: 23 mmol/L (ref 22–32)
Calcium: 8.8 mg/dL — ABNORMAL LOW (ref 8.9–10.3)
Chloride: 96 mmol/L — ABNORMAL LOW (ref 98–111)
Creatinine, Ser: 0.97 mg/dL (ref 0.61–1.24)
GFR calc Af Amer: >60 mL/min (ref >60)
GFR calc non Af Amer: >60 mL/min (ref >60)
Glucose, Bld: 107 mg/dL — ABNORMAL HIGH (ref 70–99)
Potassium: 4 mmol/L (ref 3.5–5.1)
Sodium: 132 mmol/L — ABNORMAL LOW (ref 135–145)
Total Bilirubin: 0.7 mg/dL (ref 0.3–1.2)
Total Protein: 7.8 g/dL (ref 6.5–8.1)

## 2019-10-22 NOTE — Discharge Instructions (Addendum)
Please maintain isolation precautions pending results for COVID-19 testing.  Your laboratory work-up, imaging, and physical exam is reassuring.  I suspect that you have a viral illness.  Even if you are negative for COVID-19, I suspect that this is viral etiology.  I recommend that you continue to check your temperature regularly and take Tylenol or ibuprofen as needed for fevers.  I also encourage you to take over-the-counter medications for symptomatic relief of your cough and cold symptoms.    Please return to the ED or seek immediate medical attention should you experience any new or worsening symptoms.

## 2019-10-22 NOTE — ED Triage Notes (Signed)
Cough  For 2 weeks, body aches onset 2 days

## 2019-10-22 NOTE — ED Provider Notes (Signed)
[facility] EMERGENCY DEPARTMENT Provider Note   CSN: [id number] Arrival date & time: 10/22/19  1652     History Chief Complaint  Patient presents with  . Cough  . Generalized Body Aches    Kyle Lane is a 59 y.o. male with PMH of HTN who presents the ED with a 2-week history of cough however worse in the past 3 days.  Patient states that his granddaughter returned from [place] and tested positive for COVID-19.  He states that for the past 3 days, he has had worsening cough symptoms in conjunction with body aches, chills, diminished appetite, and "loss of taste".  He is concerned for possible COVID-19 infection.  He has not had his vaccination.  He denies any fevers, chest pain, difficulty breathing, sore throat, headache, urinary symptoms, history of clots or clotting disorder, exertional symptoms, or changes in bowel habits.   HPI     Past Medical History:  Diagnosis Date  . Hypertension     Patient Active Problem List   Diagnosis Date Noted  . Impaired fasting glucose 01/24/2014  . Arthritis 09/03/2013  . Essential hypertension, benign 12/09/2012  . Hyperlipidemia LDL goal <130 12/09/2012  . GERD 11/17/2008  . HEMATOCHEZIA 11/17/2008    History reviewed. No pertinent surgical history.     No family history on file.  Social History   Tobacco Use  . Smoking status: Never Smoker  . Smokeless tobacco: Never Used  Substance Use Topics  . Alcohol use: Yes  . Drug use: Never    Home Medications Prior to Admission medications   Medication Sig Start Date End Date Taking? Authorizing Provider  losartan-hydrochlorothiazide (HYZAAR) 100-12.5 MG tablet Take 1 tablet by mouth daily. 06/22/19   Mikey Kirschner, MD  simvastatin (ZOCOR) 20 MG tablet TAKE 1 TABLET(20 MG) BY MOUTH EVERY EVENING 06/22/19   Mikey Kirschner, MD    Allergies    Patient has no known allergies.  Review of Systems   Review of Systems  All other systems reviewed and are  negative.   Physical Exam Updated Vital Signs BP 132/87   Pulse 93   Temp 100.3 F (37.9 C)   Resp (!) 94   SpO2 94%   Physical Exam Vitals and nursing note reviewed. Exam conducted with a chaperone present.  Constitutional:      General: He is not in acute distress.    Appearance: Normal appearance. He is not ill-appearing.  HENT:     Head: Normocephalic and atraumatic.     Nose: No congestion.  Eyes:     General: No scleral icterus.    Conjunctiva/sclera: Conjunctivae normal.  Cardiovascular:     Rate and Rhythm: Normal rate and regular rhythm.     Pulses: Normal pulses.     Heart sounds: Normal heart sounds.  Pulmonary:     Comments: No increased work of breathing.  Symmetric chest rise.  No rales or wheezing appreciated.  No accessory muscle use. Abdominal:     General: Abdomen is flat. There is no distension.     Palpations: Abdomen is soft.     Tenderness: There is no abdominal tenderness.  Musculoskeletal:     Cervical back: Normal range of motion.  Skin:    General: Skin is dry.     Capillary Refill: Capillary refill takes less than 2 seconds.  Neurological:     Mental Status: He is alert.     GCS: GCS eye subscore is 4. GCS verbal subscore is  5. GCS motor subscore is 6.  Psychiatric:        Mood and Affect: Mood normal.        Behavior: Behavior normal.        Thought Content: Thought content normal.     ED Results / Procedures / Treatments   Labs (all labs ordered are listed, but only abnormal results are displayed) Labs Reviewed  CBC WITH DIFFERENTIAL/PLATELET - Abnormal; Notable for the following components:      Result Value   MCV 100.7 (*)    All other components within normal limits  COMPREHENSIVE METABOLIC PANEL - Abnormal; Notable for the following components:   Sodium 132 (*)    Chloride 96 (*)    Glucose, Bld 107 (*)    Calcium 8.8 (*)    All other components within normal limits  SARS CORONAVIRUS 2 BY RT PCR (HOSPITAL ORDER, [facility])    EKG None  Radiology DG Chest Port 1 View  Result Date: 10/22/2019 CLINICAL DATA:  Provided history: Patient states cough for 2 weeks, body aches for 2 days, fever and borderline tachycardia noted in triage, oxygen saturation 94. EXAM: PORTABLE CHEST 1 VIEW COMPARISON:  No pertinent prior exams are available for comparison. FINDINGS: Heart size within normal limits. There is minimal linear atelectasis or scarring within the left lung base. Mild scarring is also noted within the lung apices. No appreciable airspace consolidation or pulmonary edema. No evidence of pleural effusion or pneumothorax. No acute bony abnormality identified. IMPRESSION: Minimal linear atelectasis or scarring within the left lung base. Mild scarring is also noted within the lung apices. No appreciable airspace consolidation or pulmonary edema. Electronically Signed   By: Kellie Simmering DO   On: 10/22/2019 21:43    Procedures Procedures (including critical care time)  Medications Ordered in ED Medications - No data to display  ED Course  I have reviewed the triage vital signs and the nursing notes.  Pertinent labs & imaging results that were available during my care of the patient were reviewed by me and considered in my medical decision making (see chart for details).    MDM Rules/Calculators/A&P                          Patient with symptoms consistent with URI for 3 days.  Patient's CXR is negative for acute infiltrate.  Symptoms are likely of viral etiology.  Discussed that antibiotics are not indicated for viral infections.  Patient will be discharged with symptomatic treatment.  Patient is tolerating food and liquid without difficulty and I do not believe that laboratory work-up would yield any significant findings.  I emphasized the importance of rest, continued oral hydration, and antipyretics as needed for fever control.  Patient would like work note as he was supposed to work  this evening.  They were provided opportunity to ask any additional questions and have none at this time.  Prior to discharge patient is feeling well, agreeable with plan for discharge home.  They have expressed understanding of verbal discharge instructions as well as return precautions and are agreeable to the plan.   Kyle Lane was evaluated in Emergency Department on 10/22/2019 for the symptoms described in the history of present illness. He was evaluated in the context of the global COVID-19 pandemic, which necessitated consideration that the patient might be at risk for infection with the SARS-CoV-2 virus that causes COVID-19. Institutional protocols and  algorithms that pertain to the evaluation of patients at risk for COVID-19 are in a state of rapid change based on information released by regulatory bodies including the CDC and federal and state organizations. These policies and algorithms were followed during the patient's care in the ED.   Final Clinical Impression(s) / ED Diagnoses Final diagnoses:  Viral upper respiratory tract infection    Rx / DC Orders ED Discharge Orders    None       Corena Herter, PA-C 10/22/19 2313    Truddie Hidden, MD 10/23/19 [phone]

## 2019-10-23 ENCOUNTER — Telehealth: Payer: Self-pay | Admitting: Family Medicine

## 2019-10-23 ENCOUNTER — Encounter: Payer: Self-pay | Admitting: Family Medicine

## 2019-10-23 LAB — SARS CORONAVIRUS 2 BY RT PCR (HOSPITAL ORDER, PERFORMED IN ~~LOC~~ HOSPITAL LAB): SARS Coronavirus 2: POSITIVE — AB

## 2019-10-23 NOTE — Telephone Encounter (Signed)
Patient went to ER last night with cough and body aches. He states they gave him a Covid test and it came back positive. He is requesting a work note .Because ER only gave him a note for two days. Please advise

## 2019-10-23 NOTE — Telephone Encounter (Signed)
Patient notified and advised we will mail work note to his home address. Patient given number to outpatient infusion center to call and talked to them about possible infusion. Patient advised of warning signs and to go straight to Er if having any problems. Patient verbalized understanding.

## 2019-10-23 NOTE — Telephone Encounter (Signed)
1.  He may have a work excuse for 10 days 2.  He needs to self isolate for 10 days 3.  Should he have any warning signs/red flags such as shortness of breath difficulty getting his breath passing out or progressive symptoms he needs to seek emergency care at the ED #4-he would benefit from consultation in infusion from the infusion center to lessen the risk of severe Covid.  Because of his age blood pressure and ethnicity he is at higher risk of complications.  These complications often occur 7 to 10 days after the initial illness.  The best time to prevent these is now.  I recommend referral to outpatient infusion center, [phone] A provider from the infusion center will call him and discuss the potential benefits of the infusion in lessening the risk of severe Covid

## 2019-10-23 NOTE — Telephone Encounter (Signed)
Patient with cough, body aches and chills and loss of taste for 3 days- granddaughter just returned from [place] and tested positive for Covid- Patient tested positive for Covid yesterday and needs to know how long to be out of work and also needs work note to cover

## 2019-10-24 ENCOUNTER — Telehealth (HOSPITAL_COMMUNITY): Payer: Self-pay | Admitting: Oncology

## 2019-10-24 NOTE — Telephone Encounter (Signed)
Called to Discuss with patient about Covid symptoms and the use of regeneron, a monoclonal antibody infusion for those with mild to moderate Covid symptoms and at a high risk of hospitalization.     Pt is qualified for this infusion at the [facility] center due to co-morbid conditions and/or a member of an at-risk group.     Spoke to patient. His symptoms are improving each day. He would like to hold off on treatment at this time with MAB infusion. He is currently day 5 (symptom onset 10/20/2019). He was given the hotline # to call if symptoms worsen.   Rulon Abide, NP, AGNP-C [phone] ([place])

## 2019-10-28 ENCOUNTER — Emergency Department (HOSPITAL_COMMUNITY)
Admission: EM | Admit: 2019-10-28 | Discharge: 2019-10-28 | Disposition: A | Discharge status: Home / Self Care | Payer: Federal, State, Local not specified - PPO | Attending: Emergency Medicine | Admitting: Emergency Medicine

## 2019-10-28 ENCOUNTER — Other Ambulatory Visit: Payer: Self-pay

## 2019-10-28 ENCOUNTER — Encounter (HOSPITAL_COMMUNITY): Payer: Self-pay

## 2019-10-28 DIAGNOSIS — R0602 Shortness of breath: Secondary | ICD-10-CM | POA: Diagnosis not present

## 2019-10-28 DIAGNOSIS — U071 COVID-19: Secondary | ICD-10-CM | POA: Diagnosis not present

## 2019-10-28 DIAGNOSIS — E860 Dehydration: Secondary | ICD-10-CM | POA: Diagnosis not present

## 2019-10-28 DIAGNOSIS — I10 Essential (primary) hypertension: Secondary | ICD-10-CM | POA: Diagnosis not present

## 2019-10-28 DIAGNOSIS — R630 Anorexia: Secondary | ICD-10-CM | POA: Diagnosis not present

## 2019-10-28 DIAGNOSIS — Z79899 Other long term (current) drug therapy: Secondary | ICD-10-CM | POA: Diagnosis not present

## 2019-10-28 LAB — CBC WITH DIFFERENTIAL/PLATELET
Abs Immature Granulocytes: 0.14 10*3/uL — ABNORMAL HIGH (ref 0.00–0.07)
Basophils Absolute: 0 10*3/uL (ref 0.0–0.1)
Basophils Relative: 0 %
Eosinophils Absolute: 0 10*3/uL (ref 0.0–0.5)
Eosinophils Relative: 0 %
HCT: 47 % (ref 39.0–52.0)
Hemoglobin: 15.9 g/dL (ref 13.0–17.0)
Immature Granulocytes: 1 %
Lymphocytes Relative: 11 %
Lymphs Abs: 1.1 10*3/uL (ref 0.7–4.0)
MCH: 33.1 pg (ref 26.0–34.0)
MCHC: 33.8 g/dL (ref 30.0–36.0)
MCV: 97.9 fL (ref 80.0–100.0)
Monocytes Absolute: 0.8 10*3/uL (ref 0.1–1.0)
Monocytes Relative: 8 %
Neutro Abs: 8.2 10*3/uL — ABNORMAL HIGH (ref 1.7–7.7)
Neutrophils Relative %: 80 %
Platelets: 310 10*3/uL (ref 150–400)
RBC: 4.8 MIL/uL (ref 4.22–5.81)
RDW: 12.2 % (ref 11.5–15.5)
WBC: 10.3 10*3/uL (ref 4.0–10.5)
nRBC: 0 % (ref 0.0–0.2)

## 2019-10-28 LAB — COMPREHENSIVE METABOLIC PANEL WITH GFR
ALT: 15 U/L (ref 0–44)
AST: 25 U/L (ref 15–41)
Albumin: 3.5 g/dL (ref 3.5–5.0)
Alkaline Phosphatase: 73 U/L (ref 38–126)
Anion gap: 12 (ref 5–15)
BUN: 18 mg/dL (ref 6–20)
CO2: 27 mmol/L (ref 22–32)
Calcium: 8.4 mg/dL — ABNORMAL LOW (ref 8.9–10.3)
Chloride: 88 mmol/L — ABNORMAL LOW (ref 98–111)
Creatinine, Ser: 0.96 mg/dL (ref 0.61–1.24)
GFR calc Af Amer: >60 mL/min
GFR calc non Af Amer: >60 mL/min
Glucose, Bld: 109 mg/dL — ABNORMAL HIGH (ref 70–99)
Potassium: 4 mmol/L (ref 3.5–5.1)
Sodium: 127 mmol/L — ABNORMAL LOW (ref 135–145)
Total Bilirubin: 1.1 mg/dL (ref 0.3–1.2)
Total Protein: 7.6 g/dL (ref 6.5–8.1)

## 2019-10-28 MED ORDER — SODIUM CHLORIDE 0.9 % IV BOLUS
500.0000 mL | Freq: Once | INTRAVENOUS | Status: AC
  Administered 2019-10-28: 500 mL via INTRAVENOUS

## 2019-10-28 MED ORDER — ONDANSETRON HCL 4 MG/2ML IJ SOLN
4.0000 mg | Freq: Once | INTRAMUSCULAR | Status: AC
  Administered 2019-10-28: 4 mg via INTRAVENOUS
  Filled 2019-10-28: qty 2

## 2019-10-28 MED ORDER — ONDANSETRON 4 MG PO TBDP: 4.0000 mg | ORAL_TABLET | Freq: Three times a day (TID) | ORAL | 0 refills | Status: DC | PRN

## 2019-10-28 NOTE — ED Provider Notes (Signed)
[place] Provider Note   CSN: [id number] Arrival date & time: 10/28/19  1528     History Chief Complaint  Patient presents with  . Shortness of Breath    covid positive    Kyle Lane is a 59 y.o. male presenting for evaluation of difficulty eating in the setting of a Covid infection.  Patient states he developed symptoms 1 week ago.  He tested positive for Covid last weekend.  Over the past several days, he has had gradually increasing difficulty eating.  He states that when he eats solid foods, he feels like it becomes very thick and hard to swallow.  He is able to tolerate liquids without difficulty.  He denies nausea, vomited x1 today.  He reports mild cough, only with deep inspiration.  No shortness of breath.  No chest pain.  No abdominal pain.  He has a history of GERD, does not take medication for this. H/o htn.  No other medical problems.  HPI     Past Medical History:  Diagnosis Date  . Hypertension     Patient Active Problem List   Diagnosis Date Noted  . Impaired fasting glucose 01/24/2014  . Arthritis 09/03/2013  . Essential hypertension, benign 12/09/2012  . Hyperlipidemia LDL goal <130 12/09/2012  . GERD 11/17/2008  . HEMATOCHEZIA 11/17/2008    History reviewed. No pertinent surgical history.     History reviewed. No pertinent family history.  Social History   Tobacco Use  . Smoking status: Never Smoker  . Smokeless tobacco: Never Used  Substance Use Topics  . Alcohol use: Yes  . Drug use: Never    Home Medications Prior to Admission medications   Medication Sig Start Date End Date Taking? Authorizing Provider  losartan-hydrochlorothiazide (HYZAAR) 100-12.5 MG tablet Take 1 tablet by mouth daily. 06/22/19   Mikey Kirschner, MD  simvastatin (ZOCOR) 20 MG tablet TAKE 1 TABLET(20 MG) BY MOUTH EVERY EVENING 06/22/19   Mikey Kirschner, MD    Allergies    Patient has no known allergies.  Review of Systems     Review of Systems  Constitutional: Positive for appetite change.  All other systems reviewed and are negative.   Physical Exam Updated Vital Signs BP (!) 139/91 (BP Location: Right Arm)   Pulse 98   Temp 99 F (37.2 C) (Oral)   Resp 19   Ht 5\' 6"  (1.676 m)   Wt 83.9 kg   SpO2 94%   BMI 29.86 kg/m   Physical Exam Vitals and nursing note reviewed.  Constitutional:      General: He is not in acute distress.    Appearance: He is well-developed.     Comments: Resting in the bed in no acute distress  HENT:     Head: Normocephalic and atraumatic.  Cardiovascular:     Rate and Rhythm: Normal rate and regular rhythm.     Pulses: Normal pulses.  Pulmonary:     Effort: Pulmonary effort is normal. No respiratory distress.     Breath sounds: Normal breath sounds. No stridor. No wheezing.     Comments: Clear lung sounds in all fields Abdominal:     General: There is no distension.     Palpations: Abdomen is soft. There is no mass.     Tenderness: There is no abdominal tenderness. There is no guarding or rebound.     Comments: No TTP of the abdomen  Musculoskeletal:        General: Normal  range of motion.     Cervical back: Normal range of motion.  Skin:    General: Skin is warm.     Findings: No rash.  Neurological:     Mental Status: He is alert and oriented to person, place, and time.     ED Results / Procedures / Treatments   Labs (all labs ordered are listed, but only abnormal results are displayed) Labs Reviewed  CBC WITH DIFFERENTIAL/PLATELET - Abnormal; Notable for the following components:      Result Value   Neutro Abs 8.2 (*)    Abs Immature Granulocytes 0.14 (*)    All other components within normal limits  COMPREHENSIVE METABOLIC PANEL - Abnormal; Notable for the following components:   Sodium 127 (*)    Chloride 88 (*)    Glucose, Bld 109 (*)    Calcium 8.4 (*)    All other components within normal limits    EKG None  Radiology No results  found.  Procedures Procedures (including critical care time)  Medications Ordered in ED Medications  sodium chloride 0.9 % bolus 500 mL (500 mLs Intravenous New Bag/Given 10/28/19 1721)  ondansetron (ZOFRAN) injection 4 mg (4 mg Intravenous Given 10/28/19 1722)    ED Course  I have reviewed the triage vital signs and the nursing notes.  Pertinent labs & imaging results that were available during my care of the patient were reviewed by me and considered in my medical decision making (see chart for details).    MDM Rules/Calculators/A&P                          Patient presenting for evaluation of poor appetite in the setting of Covid infection.  On exam, patient appears nontoxic.  Vital signs are stable.  Doubt concerning dehydration.  He does have GERD, consider GERD flareup as cause for feeling like he is having difficulty swallowing.  He is able to tolerate liquids out difficulty.  Will check labs to ensure no significant electrolyte or kidney dysfunction.  Will give Zofran and reassess after p.o. challenge.  Labs overall reassuring.  No leukocytosis.  Hemoglobin stable.  Electrolytes stable, with a sodium and chloride slightly low.  Patient received IV fluids in the ED.  Patient tolerating p.o. after Zofran.  Will encourage continued p.o. hydration.  Discussed continued symptomatic treatment as needed.  At this time, patient appears safe for discharge.  Return precautions given.  Patient states he understands and agrees to plan.   Final Clinical Impression(s) / ED Diagnoses Final diagnoses:  Dehydration  Decreased appetite  COVID-19    Rx / DC Orders ED Discharge Orders    None       Franchot Heidelberg, PA-C 10/28/19 Danelle Berry, MD 10/28/19 2317

## 2019-10-28 NOTE — Discharge Instructions (Signed)
Use Zofran as needed for nausea or vomiting. Make sure staying well-hydrated water. Fluids are much more important and solid foods. Return to the emergency room if you develop increased shortness of breath, persistent vomiting, severe worsening abdominal pain, or any new, worsening, or concerning symptoms.

## 2019-10-28 NOTE — ED Notes (Signed)
Pt had some water and crackers, pt denies nausea, states that he is ready to go home.

## 2019-10-28 NOTE — ED Notes (Signed)
Water given for po challenge.

## 2019-10-28 NOTE — ED Triage Notes (Signed)
Pt to er, pt states that he was here on Friday and he was called and told that he had covid, pt states that he hasn't been able to eat anything since then, states that when he doesn't have any appetite and has decreased energy.

## 2019-11-06 ENCOUNTER — Telehealth: Payer: Self-pay | Admitting: Family Medicine

## 2019-11-06 NOTE — Telephone Encounter (Signed)
Pt states he is getting better but still weak and coughing and wants to extend work excuse. Would like to extend to return on august 30th.

## 2019-11-06 NOTE — Telephone Encounter (Signed)
May have a work note I would recommend that if the patient feels he is in any danger regarding shortness of breath he ought to go to the ER otherwise I would like for him to do a follow-up visit early next week this could be at the late afternoon as a car visit to listen to his lungs and recheck him

## 2019-11-06 NOTE — Telephone Encounter (Signed)
Let Dr. Nicki Reaper address on Monday. Thx. Dr. Lovena Le

## 2019-11-06 NOTE — Telephone Encounter (Signed)
Please give work excuse. Pt states his daughter will pick up note on Monday.

## 2019-11-06 NOTE — Telephone Encounter (Signed)
Discussed with pt. Pt verbalized understanding. Pt transferred to the front to schedule car visit next week.

## 2019-11-06 NOTE — Telephone Encounter (Signed)
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  Pt call back [phone]

## 2019-11-09 ENCOUNTER — Ambulatory Visit (INDEPENDENT_AMBULATORY_CARE_PROVIDER_SITE_OTHER): Payer: Federal, State, Local not specified - PPO | Admitting: Family Medicine

## 2019-11-09 ENCOUNTER — Other Ambulatory Visit: Payer: Self-pay | Admitting: *Deleted

## 2019-11-09 ENCOUNTER — Encounter: Payer: Self-pay | Admitting: Family Medicine

## 2019-11-09 DIAGNOSIS — K5900 Constipation, unspecified: Secondary | ICD-10-CM | POA: Diagnosis not present

## 2019-11-09 DIAGNOSIS — U071 COVID-19: Secondary | ICD-10-CM | POA: Diagnosis not present

## 2019-11-09 DIAGNOSIS — Z1211 Encounter for screening for malignant neoplasm of colon: Secondary | ICD-10-CM

## 2019-11-09 NOTE — Progress Notes (Signed)
   Subjective:    Patient ID: AMOL DOMANSKI, male    DOB: February 27, 1961, 59 y.o.   MRN: [id number]  Cough Episode onset: 2 weeks  The cough is non-productive (Patient tested positive for Covid 2 weeks ago and still experiencing fatigue and cough. ).  Significant cough congestion denies high fever chills sweats denies wheezing difficulty breathing relates a lot of fatigue and tiredness tested +2 weeks ago    Review of Systems  Respiratory: Positive for cough.    See above    Objective:   Physical Exam O2 saturation 95% no respiratory distress ears are normal throat is normal heart regular       Assessment & Plan:  Covid We will take him a while to get over this Should gradually be able to get back into activity by later next week.  Work excuse for all of this week Do not recommend x-ray Warning signs were discussed  Patient states since he got sick he noticed some constipation we did talk about the importance of getting plenty of fiber into the diet thin liquids Referral for colonoscopy for later this fall

## 2019-11-10 ENCOUNTER — Telehealth: Payer: Self-pay | Admitting: Family Medicine

## 2019-11-10 ENCOUNTER — Encounter: Payer: Self-pay | Admitting: Family Medicine

## 2019-11-10 NOTE — Telephone Encounter (Signed)
A letter was dictated, please print letter, I can sign the letter, please provide letter to the patient.

## 2019-11-10 NOTE — Telephone Encounter (Signed)
Pt called back he has received two letters about being out of work due to [employer] on the 6th of August. 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   Pt call back [phone]

## 2019-11-11 NOTE — Telephone Encounter (Signed)
Spoke with PT informed them of letter left up front

## 2019-11-11 NOTE — Telephone Encounter (Signed)
Tried to contact pt. No answer vm not set up. Letter up front ready for pick up

## 2019-11-12 ENCOUNTER — Encounter: Payer: Self-pay | Admitting: Family Medicine

## 2019-11-17 ENCOUNTER — Encounter: Payer: Self-pay | Admitting: Internal Medicine

## 2020-01-07 ENCOUNTER — Telehealth: Payer: Self-pay | Admitting: *Deleted

## 2020-01-07 ENCOUNTER — Encounter: Payer: Self-pay | Admitting: *Deleted

## 2020-01-07 ENCOUNTER — Encounter: Payer: Self-pay | Admitting: Gastroenterology

## 2020-01-07 ENCOUNTER — Ambulatory Visit: Payer: Federal, State, Local not specified - PPO | Admitting: Gastroenterology

## 2020-01-07 ENCOUNTER — Other Ambulatory Visit: Payer: Self-pay

## 2020-01-07 DIAGNOSIS — Z1211 Encounter for screening for malignant neoplasm of colon: Secondary | ICD-10-CM | POA: Diagnosis not present

## 2020-01-07 NOTE — Patient Instructions (Signed)
We are arranging a colonoscopy in the near future.  Continue high fiber food choices. We will see you back as needed unless something changes!  It was a pleasure to see you today. I want to create trusting relationships with patients to provide genuine, compassionate, and quality care. I value your feedback. If you receive a survey regarding your visit,  I greatly appreciate you taking time to fill this out.   Annitta Needs, PhD, ANP-BC [facility]

## 2020-01-07 NOTE — Telephone Encounter (Signed)
Called pt. He has been scheduled for TCS with Dr. Gala Romney on 12/22 at 8:30am. Aware will mail prep instructions with his covid test appt. Confirmed mailing address

## 2020-01-07 NOTE — Progress Notes (Signed)
Cc'ed to pcp °

## 2020-01-07 NOTE — Progress Notes (Signed)
Primary Care Physician:  Erven Colla, DO  Referring Physician: Dr. Wolfgang Phoenix  Primary Gastroenterologist:  Dr. Gala Romney  Chief Complaint  Patient presents with  . Consult    TCS done 10-12 years ago    HPI:   Kyle Lane is a 59 y.o. male presenting today at the request of Dr. Wolfgang Phoenix for screening colonoscopy and reported constipation. Last colonoscopy in 2010 with pancolonic diverticula, no adenomas.   BM once or twice a day. No straining. No blood in stool. He had noted constipation after bout with Covid in August. Apples and grapes helped with constipation after COVID, but now doing well. No abdominal pain. Lost weight after COVID but then regained after and back to normal. No dysphagia. Used to have occasional heartburn but changed when changing his diet and stopped overeating. No N/V. He is back to his baseline bowel habits now.   Past Medical History:  Diagnosis Date  . Hypercholesterolemia   . Hypertension     Past Surgical History:  Procedure Laterality Date  . COLONOSCOPY  2010   Dr. Gala Romney: Minimally friable anal canal, otherwise normal rectum. Few scattered pancolonic diverticula, diminutive polyp at appendiceal orifice s/p removal. (polypoid rectal mucosa).     Current Outpatient Medications  Medication Sig Dispense Refill  . losartan-hydrochlorothiazide (HYZAAR) 100-12.5 MG tablet Take 1 tablet by mouth daily. 90 tablet 1  . simvastatin (ZOCOR) 20 MG tablet TAKE 1 TABLET(20 MG) BY MOUTH EVERY EVENING 90 tablet 1   No current facility-administered medications for this visit.    Allergies as of 01/07/2020  . (No Known Allergies)    Family History  Problem Relation Age of Onset  . Colon cancer Neg Hx   . Colon polyps Neg Hx     Social History   Socioeconomic History  . Marital status: Single    Spouse name: Not on file  . Number of children: Not on file  . Years of education: Not on file  . Highest education level: Not on file    Occupational History  . Not on file  Tobacco Use  . Smoking status: Never Smoker  . Smokeless tobacco: Never Used  Substance and Sexual Activity  . Alcohol use: Yes    Comment: 1 beer per day  . Drug use: Never  . Sexual activity: Not on file  Other Topics Concern  . Not on file  Social History Narrative  . Not on file   Social Determinants of Health   Financial Resource Strain:   . Difficulty of Paying Living Expenses: Not on file  Food Insecurity:   . Worried About Charity fundraiser in the Last Year: Not on file  . Ran Out of Food in the Last Year: Not on file  Transportation Needs:   . Lack of Transportation (Medical): Not on file  . Lack of Transportation (Non-Medical): Not on file  Physical Activity:   . Days of Exercise per Week: Not on file  . Minutes of Exercise per Session: Not on file  Stress:   . Feeling of Stress : Not on file  Social Connections:   . Frequency of Communication with Friends and Family: Not on file  . Frequency of Social Gatherings with Friends and Family: Not on file  . Attends Religious Services: Not on file  . Active Member of Clubs or Organizations: Not on file  . Attends Archivist Meetings: Not on file  . Marital Status: Not on file  Intimate Partner Violence:   . Fear of Current or Ex-Partner: Not on file  . Emotionally Abused: Not on file  . Physically Abused: Not on file  . Sexually Abused: Not on file    Review of Systems: Gen: Denies any fever, chills, fatigue, weight loss, lack of appetite.  CV: Denies chest pain, heart palpitations, peripheral edema, syncope.  Resp: Denies shortness of breath at rest or with exertion. Denies wheezing or cough.  GI: see HPI GU : Denies urinary burning, urinary frequency, urinary hesitancy MS: Denies joint pain, muscle weakness, cramps, or limitation of movement.  Derm: Denies rash, itching, dry skin Psych: Denies depression, anxiety, memory loss, and confusion Heme: Denies  bruising, bleeding, and enlarged lymph nodes.  Physical Exam: BP 140/88   Pulse 84   Temp (!) 97.4 F (36.3 C)   Ht 5\' 6"  (1.676 m)   Wt 182 lb (82.6 kg)   BMI 29.38 kg/m  General:   Alert and oriented. Pleasant and cooperative. Well-nourished and well-developed.  Head:  Normocephalic and atraumatic. Eyes:  Without icterus, sclera clear and conjunctiva pink.  Ears:  Normal auditory acuity. Mouth:  Mask in place Lungs:  Clear to auscultation bilaterally. No wheezes, rales, or rhonchi. No distress.  Heart:  S1, S2 present without murmurs appreciated.  Abdomen:  +BS, soft, non-tender and non-distended. No HSM noted. No guarding or rebound. No masses appreciated.  Rectal:  Deferred  Msk:  Symmetrical without gross deformities. Normal posture. Extremities:  Without edema. Neurologic:  Alert and  oriented x4;  grossly normal neurologically. Skin:  Intact without significant lesions or rashes. Psych:  Alert and cooperative. Normal mood and affect.  ASSESSMENT: Kyle Lane is a 59 y.o. male presenting today with need for routine screening colonoscopy, last approximately 10 years ago and without history of adenomas, reporting constipation in Aug 2021 after bout with COVID but now resolved.   Bowel habits are back to his baseline, and he denies any concerning GI signs/symptoms. No family history of colorectal cancer or polyps.      PLAN:  Proceed with colonoscopy by Dr. Gala Romney in near future: the risks, benefits, and alternatives have been discussed with the patient in detail. The patient states understanding and desires to proceed.  Return as needed  Discussed high fiber diet, supplemental fiber if needed in future: he is doing well currently  Annitta Needs, PhD, ANP-BC [facility]

## 2020-01-25 ENCOUNTER — Telehealth: Payer: Self-pay | Admitting: Family Medicine

## 2020-01-25 MED ORDER — LOSARTAN POTASSIUM-HCTZ 100-12.5 MG PO TABS: 1.0000 | ORAL_TABLET | Freq: Every day | ORAL | 1 refills | Status: DC

## 2020-01-25 NOTE — Telephone Encounter (Signed)
Pls give 30 days with 1 refill, and pt needs appt in 45-60 days for f/u on htn. Dr. Lovena Le

## 2020-01-25 NOTE — Telephone Encounter (Signed)
Medication sent to pharmacy with note to have office visit within 45-60 days

## 2020-01-25 NOTE — Telephone Encounter (Signed)
[facility] [address] requesting refill on Losartan/HCTZ 100/12.5 mg tablet. Take one tablet po daily. Pt last seen in August for [place]. Please advise. Thank you

## 2020-01-25 NOTE — Addendum Note (Signed)
Addended by: Vicente Males on: 01/25/2020 04:59 PM   Modules accepted: Orders

## 2020-03-08 ENCOUNTER — Telehealth: Payer: Self-pay

## 2020-03-08 ENCOUNTER — Other Ambulatory Visit: Payer: Self-pay

## 2020-03-08 ENCOUNTER — Other Ambulatory Visit (HOSPITAL_COMMUNITY)
Admission: RE | Admit: 2020-03-08 | Discharge: 2020-03-08 | Disposition: A | Discharge status: Home / Self Care | Payer: Federal, State, Local not specified - PPO | Source: Ambulatory Visit | Attending: Internal Medicine | Admitting: Internal Medicine

## 2020-03-08 DIAGNOSIS — Z1211 Encounter for screening for malignant neoplasm of colon: Secondary | ICD-10-CM | POA: Diagnosis not present

## 2020-03-08 DIAGNOSIS — Z20822 Contact with and (suspected) exposure to covid-19: Secondary | ICD-10-CM | POA: Diagnosis not present

## 2020-03-08 DIAGNOSIS — Z79899 Other long term (current) drug therapy: Secondary | ICD-10-CM | POA: Diagnosis not present

## 2020-03-08 DIAGNOSIS — Z01812 Encounter for preprocedural laboratory examination: Secondary | ICD-10-CM | POA: Insufficient documentation

## 2020-03-08 DIAGNOSIS — D127 Benign neoplasm of rectosigmoid junction: Secondary | ICD-10-CM | POA: Diagnosis not present

## 2020-03-08 DIAGNOSIS — K5730 Diverticulosis of large intestine without perforation or abscess without bleeding: Secondary | ICD-10-CM | POA: Diagnosis not present

## 2020-03-08 LAB — SARS CORONAVIRUS 2 (TAT 6-24 HRS): SARS Coronavirus 2: NEGATIVE

## 2020-03-08 NOTE — Telephone Encounter (Signed)
Pt came by office, said he didn't have TCS instructions (TCS scheduled for tomorrow). Address verified. He had covid test done today after receiving reminder call. States he hasn't ate anything since lunch 12:00-12:30pm yesterday. Denied eating anything this morning. TCS instructions given to pt and explained. Verbalized understanding.

## 2020-03-09 ENCOUNTER — Other Ambulatory Visit: Payer: Self-pay

## 2020-03-09 ENCOUNTER — Encounter (HOSPITAL_COMMUNITY): Payer: Self-pay | Admitting: Internal Medicine

## 2020-03-09 ENCOUNTER — Ambulatory Visit (HOSPITAL_COMMUNITY)
Admission: RE | Admit: 2020-03-09 | Discharge: 2020-03-09 | Disposition: A | Discharge status: Home / Self Care | Payer: Federal, State, Local not specified - PPO | Attending: Internal Medicine | Admitting: Internal Medicine

## 2020-03-09 ENCOUNTER — Encounter (HOSPITAL_COMMUNITY)
Admission: RE | Disposition: A | Discharge status: Home / Self Care | Payer: Self-pay | Source: Home / Self Care | Attending: Internal Medicine

## 2020-03-09 DIAGNOSIS — Z79899 Other long term (current) drug therapy: Secondary | ICD-10-CM | POA: Diagnosis not present

## 2020-03-09 DIAGNOSIS — Z20822 Contact with and (suspected) exposure to covid-19: Secondary | ICD-10-CM | POA: Diagnosis not present

## 2020-03-09 DIAGNOSIS — Z1211 Encounter for screening for malignant neoplasm of colon: Secondary | ICD-10-CM | POA: Diagnosis not present

## 2020-03-09 DIAGNOSIS — K5730 Diverticulosis of large intestine without perforation or abscess without bleeding: Secondary | ICD-10-CM | POA: Diagnosis not present

## 2020-03-09 DIAGNOSIS — K635 Polyp of colon: Secondary | ICD-10-CM

## 2020-03-09 DIAGNOSIS — D127 Benign neoplasm of rectosigmoid junction: Secondary | ICD-10-CM | POA: Insufficient documentation

## 2020-03-09 HISTORY — PX: POLYPECTOMY: SHX5525

## 2020-03-09 HISTORY — PX: COLONOSCOPY: SHX5424

## 2020-03-09 SURGERY — COLONOSCOPY
Anesthesia: Moderate Sedation

## 2020-03-09 MED ORDER — STERILE WATER FOR IRRIGATION IR SOLN
Status: DC | PRN
  Administered 2020-03-09: 09:00:00 500 mL

## 2020-03-09 MED ORDER — MEPERIDINE HCL 100 MG/ML IJ SOLN
INTRAMUSCULAR | Status: DC | PRN
  Administered 2020-03-09: 25 mg via INTRAVENOUS
  Administered 2020-03-09: 15 mg via INTRAVENOUS

## 2020-03-09 MED ORDER — ONDANSETRON HCL 4 MG/2ML IJ SOLN
INTRAMUSCULAR | Status: DC | PRN
  Administered 2020-03-09: 4 mg via INTRAVENOUS

## 2020-03-09 MED ORDER — SODIUM CHLORIDE 0.9 % IV SOLN: INTRAVENOUS | Status: DC

## 2020-03-09 MED ORDER — MEPERIDINE HCL 50 MG/ML IJ SOLN
INTRAMUSCULAR | Status: AC
  Filled 2020-03-09: qty 1

## 2020-03-09 MED ORDER — ONDANSETRON HCL 4 MG/2ML IJ SOLN
INTRAMUSCULAR | Status: AC
  Filled 2020-03-09: qty 2

## 2020-03-09 MED ORDER — MIDAZOLAM HCL 5 MG/5ML IJ SOLN
INTRAMUSCULAR | Status: AC
  Filled 2020-03-09: qty 10

## 2020-03-09 MED ORDER — MIDAZOLAM HCL 5 MG/5ML IJ SOLN
INTRAMUSCULAR | Status: DC | PRN
  Administered 2020-03-09 (×2): 2 mg via INTRAVENOUS
  Administered 2020-03-09: 1 mg via INTRAVENOUS

## 2020-03-09 NOTE — H&P (Signed)
@  MEQA@   Primary Care Physician:  Erven Colla, DO Primary Gastroenterologist:  Dr. Gala Romney  Pre-Procedure History & Physical: HPI:  Kyle Lane is a 59 y.o. male is here for a screening colonoscopy.  Negative colonoscopy 10 years ago (diverticulosis).  No bowel symptoms currently.  Past Medical History:  Diagnosis Date  . Hypercholesterolemia   . Hypertension     Past Surgical History:  Procedure Laterality Date  . COLONOSCOPY  2010   Dr. Gala Romney: Minimally friable anal canal, otherwise normal rectum. Few scattered pancolonic diverticula, diminutive polyp at appendiceal orifice s/p removal. (polypoid rectal mucosa).     Prior to Admission medications   Medication Sig Start Date End Date Taking? Authorizing Provider  losartan-hydrochlorothiazide (HYZAAR) 100-12.5 MG tablet Take 1 tablet by mouth daily. 01/25/20  Yes Taylor, Malena M, DO  simvastatin (ZOCOR) 20 MG tablet TAKE 1 TABLET(20 MG) BY MOUTH EVERY EVENING Patient taking differently: Take 20 mg by mouth every evening. TAKE 1 TABLET(20 MG) BY MOUTH EVERY EVENING 06/22/19  Yes Mikey Kirschner, MD    Allergies as of 01/07/2020  . (No Known Allergies)    Family History  Problem Relation Age of Onset  . Colon cancer Neg Hx   . Colon polyps Neg Hx     Social History   Socioeconomic History  . Marital status: Single    Spouse name: Not on file  . Number of children: Not on file  . Years of education: Not on file  . Highest education level: Not on file  Occupational History  . Not on file  Tobacco Use  . Smoking status: Never Smoker  . Smokeless tobacco: Never Used  Substance and Sexual Activity  . Alcohol use: Yes    Comment: 1 beer per day  . Drug use: Never  . Sexual activity: Not on file  Other Topics Concern  . Not on file  Social History Narrative  . Not on file   Social Determinants of Health   Financial Resource Strain: Not on file  Food Insecurity: Not on file  Transportation Needs:  Not on file  Physical Activity: Not on file  Stress: Not on file  Social Connections: Not on file  Intimate Partner Violence: Not on file    Review of Systems: See HPI, otherwise negative ROS  Physical Exam: BP (!) 161/89   Temp 98.1 F (36.7 C) (Oral)   Resp 14   Ht 5\' 6"  (1.676 m)   SpO2 96%   BMI 29.38 kg/m  General:   Alert,  Well-developed, well-nourished, pleasant and cooperative in NAD Neck:  Supple; no masses or thyromegaly. Lungs:  Clear throughout to auscultation.   No wheezes, crackles, or rhonchi. No acute distress. Heart:  Regular rate and rhythm; no murmurs, clicks, rubs,  or gallops. Abdomen:  Soft, nontender and nondistended. No masses, hepatosplenomegaly or hernias noted. Normal bowel sounds, without guarding, and without rebound.    Impression/Plan: Kyle Lane is now here to undergo a screening colonoscopy.  Risks, benefits, limitations, imponderables and alternatives regarding colonoscopy have been reviewed with the patient. Questions have been answered. All parties agreeable.     Notice:  This dictation was prepared with Dragon dictation along with smaller phrase technology. Any transcriptional errors that result from this process are unintentional and may not be corrected upon review.

## 2020-03-09 NOTE — Discharge Instructions (Signed)
Colon Polyps  Polyps are tissue growths inside the body. Polyps can grow in many places, including the large intestine (colon). A polyp may be a round bump or a mushroom-shaped growth. You could have one polyp or several. Most colon polyps are noncancerous (benign). However, some colon polyps can become cancerous over time. Finding and removing the polyps early can help prevent this. What are the causes? The exact cause of colon polyps is not known. What increases the risk? You are more likely to develop this condition if you:  Have a family history of colon cancer or colon polyps.  Are older than 29 or older than 45 if you are African American.  Have inflammatory bowel disease, such as ulcerative colitis or Crohn's disease.  Have certain hereditary conditions, such as: ? Familial adenomatous polyposis. ? Lynch syndrome. ? Turcot syndrome. ? Peutz-Jeghers syndrome.  Are overweight.  Smoke cigarettes.  Do not get enough exercise.  Drink too much alcohol.  Eat a diet that is high in fat and red meat and low in fiber.  Had childhood cancer that was treated with abdominal radiation. What are the signs or symptoms? Most polyps do not cause symptoms. If you have symptoms, they may include:  Blood coming from your rectum when having a bowel movement.  Blood in your stool. The stool may look dark red or black.  Abdominal pain.  A change in bowel habits, such as constipation or diarrhea. How is this diagnosed? This condition is diagnosed with a colonoscopy. This is a procedure in which a lighted, flexible scope is inserted into the anus and then passed into the colon to examine the area. Polyps are sometimes found when a colonoscopy is done as part of routine cancer screening tests. How is this treated? Treatment for this condition involves removing any polyps that are found. Most polyps can be removed during a colonoscopy. Those polyps will then be tested for cancer. Additional  treatment may be needed depending on the results of testing. Follow these instructions at home: Lifestyle  Maintain a healthy weight, or lose weight if recommended by your health care provider.  Exercise every day or as told by your health care provider.  Do not use any products that contain nicotine or tobacco, such as cigarettes and e-cigarettes. If you need help quitting, ask your health care provider.  If you drink alcohol, limit how much you have: ? 0-1 drink a day for women. ? 0-2 drinks a day for men.  Be aware of how much alcohol is in your drink. In the [place] one drink equals one 12 oz bottle of beer (355 mL), one 5 oz glass of wine (148 mL), or one 1 oz shot of hard liquor (44 mL). Eating and drinking   Eat foods that are high in fiber, such as fruits, vegetables, and whole grains.  Eat foods that are high in calcium and vitamin D, such as milk, cheese, yogurt, eggs, liver, fish, and broccoli.  Limit foods that are high in fat, such as fried foods and desserts.  Limit the amount of red meat and processed meat you eat, such as hot dogs, sausage, bacon, and lunch meats. General instructions  Keep all follow-up visits as told by your health care provider. This is important. ? This includes having regularly scheduled colonoscopies. ? Talk to your health care provider about when you need a colonoscopy. Contact a health care provider if:  You have new or worsening bleeding during a bowel movement.  You  have new or increased blood in your stool.  You have a change in bowel habits.  You lose weight for no known reason. Summary  Polyps are tissue growths inside the body. Polyps can grow in many places, including the colon.  Most colon polyps are noncancerous (benign), but some can become cancerous over time.  This condition is diagnosed with a colonoscopy.  Treatment for this condition involves removing any polyps that are found. Most polyps can be removed during a  colonoscopy. This information is not intended to replace advice given to you by your health care provider. Make sure you discuss any questions you have with your health care provider. Document Revised: 06/20/2017 Document Reviewed: 06/20/2017 Elsevier Patient Education  [place]. Diverticulosis  Diverticulosis is a condition that develops when small pouches (diverticula) form in the wall of the large intestine (colon). The colon is where water is absorbed and stool (feces) is formed. The pouches form when the inside layer of the colon pushes through weak spots in the outer layers of the colon. You may have a few pouches or many of them. The pouches usually do not cause problems unless they become inflamed or infected. When this happens, the condition is called diverticulitis. What are the causes? The cause of this condition is not known. What increases the risk? The following factors may make you more likely to develop this condition:  Being older than age 79. Your risk for this condition increases with age. Diverticulosis is rare among people younger than age 84. By age 35, many people have it.  Eating a low-fiber diet.  Having frequent constipation.  Being overweight.  Not getting enough exercise.  Smoking.  Taking over-the-counter pain medicines, like aspirin and ibuprofen.  Having a family history of diverticulosis. What are the signs or symptoms? In most people, there are no symptoms of this condition. If you do have symptoms, they may include:  Bloating.  Cramps in the abdomen.  Constipation or diarrhea.  Pain in the lower left side of the abdomen. How is this diagnosed? Because diverticulosis usually has no symptoms, it is most often diagnosed during an exam for other colon problems. The condition may be diagnosed by:  Using a flexible scope to examine the colon (colonoscopy).  Taking an X-ray of the colon after dye has been put into the colon (barium  enema).  Having a CT scan. How is this treated? You may not need treatment for this condition. Your health care provider may recommend treatment to prevent problems. You may need treatment if you have symptoms or if you previously had diverticulitis. Treatment may include:  Eating a high-fiber diet.  Taking a fiber supplement.  Taking a live bacteria supplement (probiotic).  Taking medicine to relax your colon. Follow these instructions at home: Medicines  Take over-the-counter and prescription medicines only as told by your health care provider.  If told by your health care provider, take a fiber supplement or probiotic. Constipation prevention Your condition may cause constipation. To prevent or treat constipation, you may need to:  Drink enough fluid to keep your urine pale yellow.  Take over-the-counter or prescription medicines.  Eat foods that are high in fiber, such as beans, whole grains, and fresh fruits and vegetables.  Limit foods that are high in fat and processed sugars, such as fried or sweet foods.  General instructions  Try not to strain when you have a bowel movement.  Keep all follow-up visits as told by your health care  provider. This is important. Contact a health care provider if you:  Have pain in your abdomen.  Have bloating.  Have cramps.  Have not had a bowel movement in 3 days. Get help right away if:  Your pain gets worse.  Your bloating becomes very bad.  You have a fever or chills, and your symptoms suddenly get worse.  You vomit.  You have bowel movements that are bloody or black.  You have bleeding from your rectum. Summary  Diverticulosis is a condition that develops when small pouches (diverticula) form in the wall of the large intestine (colon).  You may have a few pouches or many of them.  This condition is most often diagnosed during an exam for other colon problems.  Treatment may include increasing the fiber in  your diet, taking supplements, or taking medicines. This information is not intended to replace advice given to you by your health care provider. Make sure you discuss any questions you have with your health care provider. Document Revised: 10/02/2018 Document Reviewed: 10/02/2018 Elsevier Patient Education  [place].  Colonoscopy Discharge Instructions  Read the instructions outlined below and refer to this sheet in the next few weeks. These discharge instructions provide you with general information on caring for yourself after you leave the hospital. Your doctor may also give you specific instructions. While your treatment has been planned according to the most current medical practices available, unavoidable complications occasionally occur. If you have any problems or questions after discharge, call Dr. Gala Romney at [phone]. ACTIVITY  You may resume your regular activity, but move at a slower pace for the next 24 hours.   Take frequent rest periods for the next 24 hours.   Walking will help get rid of the air and reduce the bloated feeling in your belly (abdomen).   No driving for 24 hours (because of the medicine (anesthesia) used during the test).    Do not sign any important legal documents or operate any machinery for 24 hours (because of the anesthesia used during the test).  NUTRITION  Drink plenty of fluids.   You may resume your normal diet as instructed by your doctor.   Begin with a light meal and progress to your normal diet. Heavy or fried foods are harder to digest and may make you feel sick to your stomach (nauseated).   Avoid alcoholic beverages for 24 hours or as instructed.  MEDICATIONS  You may resume your normal medications unless your doctor tells you otherwise.  WHAT YOU CAN EXPECT TODAY  Some feelings of bloating in the abdomen.   Passage of more gas than usual.   Spotting of blood in your stool or on the toilet paper.  IF YOU HAD POLYPS  REMOVED DURING THE COLONOSCOPY:  No aspirin products for 7 days or as instructed.   No alcohol for 7 days or as instructed.   Eat a soft diet for the next 24 hours.  FINDING OUT THE RESULTS OF YOUR TEST Not all test results are available during your visit. If your test results are not back during the visit, make an appointment with your caregiver to find out the results. Do not assume everything is normal if you have not heard from your caregiver or the medical facility. It is important for you to follow up on all of your test results.  SEEK IMMEDIATE MEDICAL ATTENTION IF:  You have more than a spotting of blood in your stool.   Your belly is swollen (  abdominal distention).   You are nauseated or vomiting.   You have a temperature over 101.   You have abdominal pain or discomfort that is severe or gets worse throughout the day.   Diverticulosis and polyp information provided  1 polyp removed from your colon today  Further recommendations to follow pending review of pathology report  At patient request, I called Freda Munro at [phone] and reviewed results

## 2020-03-09 NOTE — Op Note (Signed)
[facility] Patient Name: Kyle Lane Procedure Date: 03/09/2020 7:54 AM MRN: [id number] Date of Birth: [birth year] Attending MD: Gennette Pac , MD CSN: [id number] Age: 59 Admit Type: Outpatient Procedure:                Colonoscopy Indications:              Screening for colorectal malignant neoplasm Providers:                Gennette Pac, MD, Nena Polio, RN, Durwin Glaze Tech, Technician Referring MD:              Medicines:                Midazolam 5 mg IV, Meperidine 40 mg IV Complications:            No immediate complications. Estimated Blood Loss:     Estimated blood loss was minimal. Procedure:                Pre-Anesthesia Assessment:                           - Prior to the procedure, a History and Physical                            was performed, and patient medications and                            allergies were reviewed. The patient's tolerance of                            previous anesthesia was also reviewed. The risks                            and benefits of the procedure and the sedation                            options and risks were discussed with the patient.                            All questions were answered, and informed consent                            was obtained. Prior Anticoagulants: The patient has                            taken no previous anticoagulant or antiplatelet                            agents. ASA Grade Assessment: II - A patient with                            mild systemic disease. After reviewing the risks  and benefits, the patient was deemed in                            satisfactory condition to undergo the procedure.                           After obtaining informed consent, the colonoscope                            was passed under direct vision. Throughout the                            procedure, the patient's blood pressure, pulse, and                             oxygen saturations were monitored continuously. The                            CF-HQ190L ([id number]) scope was introduced through                            the anus and advanced to the the cecum, identified                            by appendiceal orifice and ileocecal valve. The                            colonoscopy was performed without difficulty. The                            patient tolerated the procedure well. The quality                            of the bowel preparation was adequate. Scope In: 8:35:52 AM Scope Out: 8:50:22 AM Scope Withdrawal Time: 0 hours 6 minutes 33 seconds  Total Procedure Duration: 0 hours 14 minutes 30 seconds  Findings:      The perianal and digital rectal examinations were normal.      A few small and large-mouthed diverticula were found in the entire colon.      A 4 mm polyp was found in the recto-sigmoid colon. The polyp was       sessile. The polyp was removed with a cold snare. Resection and       retrieval were complete. Estimated blood loss was minimal.      The exam was otherwise without abnormality on direct and retroflexion       views. Impression:               - Diverticulosis in the entire examined colon.                           - One 4 mm polyp at the recto-sigmoid colon,                            removed with a cold snare. Resected and retrieved.                           -  The examination was otherwise normal on direct                            and retroflexion views. Moderate Sedation:      Moderate (conscious) sedation was administered by the endoscopy nurse       and supervised by the endoscopist. The following parameters were       monitored: oxygen saturation, heart rate, blood pressure, respiratory       rate, EKG, adequacy of pulmonary ventilation, and response to care. Recommendation:           - Patient has a contact number available for                            emergencies. The signs and  symptoms of potential                            delayed complications were discussed with the                            patient. Return to normal activities tomorrow.                            Written discharge instructions were provided to the                            patient.                           - Resume previous diet.                           - Continue present medications.                           - Repeat colonoscopy date to be determined after                            pending pathology results are reviewed for                            surveillance.                           - Return to GI office after studies are complete. Procedure Code(s):        --- Professional ---                           [phone], Colonoscopy, flexible; with removal of                            tumor(s), polyp(s), or other lesion(s) by snare                            technique Diagnosis Code(s):        --- Professional ---  Z12.11, Encounter for screening for malignant                            neoplasm of colon                           K63.5, Polyp of colon                           K57.30, Diverticulosis of large intestine without                            perforation or abscess without bleeding CPT copyright 2019 American Medical Association. All rights reserved. The codes documented in this report are preliminary and upon coder review may  be revised to meet current compliance requirements. Cristopher Estimable. Azarion Hove, MD Norvel Richards, MD 03/09/2020 9:01:41 AM This report has been signed electronically. Number of Addenda: 0

## 2020-03-10 ENCOUNTER — Encounter: Payer: Self-pay | Admitting: Internal Medicine

## 2020-03-10 LAB — SURGICAL PATHOLOGY

## 2020-03-13 ENCOUNTER — Encounter: Payer: Self-pay | Admitting: Family Medicine

## 2020-03-13 DIAGNOSIS — D369 Benign neoplasm, unspecified site: Secondary | ICD-10-CM | POA: Insufficient documentation

## 2020-03-17 ENCOUNTER — Encounter (HOSPITAL_COMMUNITY): Payer: Self-pay | Admitting: Internal Medicine

## 2020-10-06 ENCOUNTER — Ambulatory Visit: Payer: Federal, State, Local not specified - PPO | Attending: Internal Medicine

## 2020-10-06 DIAGNOSIS — Z20822 Contact with and (suspected) exposure to covid-19: Secondary | ICD-10-CM | POA: Diagnosis not present

## 2020-10-07 LAB — SARS-COV-2, NAA 2 DAY TAT

## 2020-10-07 LAB — NOVEL CORONAVIRUS, NAA: SARS-CoV-2, NAA: DETECTED — AB

## 2020-10-07 LAB — SPECIMEN STATUS REPORT

## 2020-10-07 NOTE — Progress Notes (Signed)
Left message to return call times 2

## 2020-10-11 ENCOUNTER — Other Ambulatory Visit: Payer: Self-pay

## 2020-10-11 ENCOUNTER — Telehealth: Payer: Self-pay | Admitting: Family Medicine

## 2020-10-11 DIAGNOSIS — E785 Hyperlipidemia, unspecified: Secondary | ICD-10-CM

## 2020-10-11 MED ORDER — SIMVASTATIN 20 MG PO TABS: ORAL_TABLET | ORAL | 0 refills | Status: DC

## 2020-10-11 NOTE — Telephone Encounter (Signed)
Will patient need labs for medication follow up.

## 2020-10-11 NOTE — Telephone Encounter (Signed)
Will patient need labs for medication follow up, has upcoming appt. Please advise

## 2020-10-13 ENCOUNTER — Other Ambulatory Visit: Payer: Self-pay

## 2020-10-13 DIAGNOSIS — Z0000 Encounter for general adult medical examination without abnormal findings: Secondary | ICD-10-CM

## 2020-10-13 DIAGNOSIS — E785 Hyperlipidemia, unspecified: Secondary | ICD-10-CM

## 2020-10-13 DIAGNOSIS — Z125 Encounter for screening for malignant neoplasm of prostate: Secondary | ICD-10-CM

## 2020-10-20 DIAGNOSIS — Z0000 Encounter for general adult medical examination without abnormal findings: Secondary | ICD-10-CM | POA: Diagnosis not present

## 2020-10-20 DIAGNOSIS — Z125 Encounter for screening for malignant neoplasm of prostate: Secondary | ICD-10-CM | POA: Diagnosis not present

## 2020-10-20 DIAGNOSIS — E785 Hyperlipidemia, unspecified: Secondary | ICD-10-CM | POA: Diagnosis not present

## 2020-10-21 LAB — CBC WITH DIFFERENTIAL/PLATELET
Basophils Absolute: 0 10*3/uL (ref 0.0–0.2)
Basos: 1 %
EOS (ABSOLUTE): 0.4 10*3/uL (ref 0.0–0.4)
Eos: 5 %
Hematocrit: 41.9 % (ref 37.5–51.0)
Hemoglobin: 14.3 g/dL (ref 13.0–17.7)
Immature Grans (Abs): 0 10*3/uL (ref 0.0–0.1)
Immature Granulocytes: 0 %
Lymphocytes Absolute: 1.9 10*3/uL (ref 0.7–3.1)
Lymphs: 25 %
MCH: 33.3 pg — ABNORMAL HIGH (ref 26.6–33.0)
MCHC: 34.1 g/dL (ref 31.5–35.7)
MCV: 98 fL — ABNORMAL HIGH (ref 79–97)
Monocytes Absolute: 0.9 10*3/uL (ref 0.1–0.9)
Monocytes: 11 %
Neutrophils Absolute: 4.7 10*3/uL (ref 1.4–7.0)
Neutrophils: 58 %
Platelets: 257 10*3/uL (ref 150–450)
RBC: 4.29 x10E6/uL (ref 4.14–5.80)
RDW: 11.8 % (ref 11.6–15.4)
WBC: 7.9 10*3/uL (ref 3.4–10.8)

## 2020-10-21 LAB — COMPREHENSIVE METABOLIC PANEL
ALT: 19 IU/L (ref 0–44)
AST: 25 IU/L (ref 0–40)
Albumin/Globulin Ratio: 1.7 (ref 1.2–2.2)
Albumin: 4.7 g/dL (ref 3.8–4.9)
Alkaline Phosphatase: 74 IU/L (ref 44–121)
BUN/Creatinine Ratio: 18 (ref 10–24)
BUN: 20 mg/dL (ref 8–27)
Bilirubin Total: 0.8 mg/dL (ref 0.0–1.2)
CO2: 24 mmol/L (ref 20–29)
Calcium: 9.6 mg/dL (ref 8.6–10.2)
Chloride: 101 mmol/L (ref 96–106)
Creatinine, Ser: 1.09 mg/dL (ref 0.76–1.27)
Globulin, Total: 2.7 g/dL (ref 1.5–4.5)
Glucose: 98 mg/dL (ref 65–99)
Potassium: 4.7 mmol/L (ref 3.5–5.2)
Sodium: 139 mmol/L (ref 134–144)
Total Protein: 7.4 g/dL (ref 6.0–8.5)
eGFR: 78 mL/min/{1.73_m2} (ref >59)

## 2020-10-21 LAB — PSA: Prostate Specific Ag, Serum: 0.5 ng/mL (ref 0.0–4.0)

## 2020-10-21 LAB — LIPID PANEL
Chol/HDL Ratio: 4.6 ratio (ref 0.0–5.0)
Cholesterol, Total: 211 mg/dL — ABNORMAL HIGH (ref 100–199)
HDL: 46 mg/dL (ref >39)
LDL Chol Calc (NIH): 142 mg/dL — ABNORMAL HIGH (ref 0–99)
Triglycerides: 125 mg/dL (ref 0–149)
VLDL Cholesterol Cal: 23 mg/dL (ref 5–40)

## 2020-10-21 LAB — HEMOGLOBIN A1C
Est. average glucose Bld gHb Est-mCnc: 123 mg/dL
Hgb A1c MFr Bld: 5.9 % — ABNORMAL HIGH (ref 4.8–5.6)

## 2020-10-24 ENCOUNTER — Ambulatory Visit: Payer: Federal, State, Local not specified - PPO | Admitting: Family Medicine

## 2020-11-01 ENCOUNTER — Encounter: Payer: Self-pay | Admitting: Family Medicine

## 2020-11-01 ENCOUNTER — Other Ambulatory Visit: Payer: Self-pay

## 2020-11-01 ENCOUNTER — Ambulatory Visit (INDEPENDENT_AMBULATORY_CARE_PROVIDER_SITE_OTHER): Payer: Federal, State, Local not specified - PPO | Admitting: Family Medicine

## 2020-11-01 VITALS — BP 126/76 | HR 100 | Temp 97.2°F | Wt 188.6 lb

## 2020-11-01 DIAGNOSIS — E785 Hyperlipidemia, unspecified: Secondary | ICD-10-CM | POA: Diagnosis not present

## 2020-11-01 DIAGNOSIS — R7301 Impaired fasting glucose: Secondary | ICD-10-CM | POA: Diagnosis not present

## 2020-11-01 DIAGNOSIS — I10 Essential (primary) hypertension: Secondary | ICD-10-CM

## 2020-11-01 MED ORDER — SIMVASTATIN 20 MG PO TABS
ORAL_TABLET | ORAL | 1 refills | Status: DC
Start: 2020-11-01 — End: 2021-06-15

## 2020-11-01 MED ORDER — LOSARTAN POTASSIUM-HCTZ 100-12.5 MG PO TABS
1.0000 | ORAL_TABLET | Freq: Every day | ORAL | 1 refills | Status: DC
Start: 2020-11-01 — End: 2021-07-26

## 2020-11-01 NOTE — Progress Notes (Signed)
Patient ID: Kyle Lane, male    DOB: 10-02-1960, 60 y.o.   MRN: [id number]   Chief Complaint  Patient presents with   Hypertension   Subjective:    HPI Pt here to follow up on HTN. Pt does not check BP often at home;occasionally will check at [facility]. No issues with BP. Pt taking medications as directed.   HTN Pt compliant with BP meds.  No SEs Denies chest pain, sob, LE swelling, or blurry vision.  Hld- Pt had ran out of cholesterol.  Pt ran out of zocor.  Dec carbs and sodas and now just having zero sugar soda.  Gluocse improved.  Not able to exercise very much.  Medical History Jimmy has a past medical history of Hypercholesterolemia and Hypertension.   Outpatient Encounter Medications as of 11/01/2020  Medication Sig   [DISCONTINUED] losartan-hydrochlorothiazide (HYZAAR) 100-12.5 MG tablet Take 1 tablet by mouth daily.   [DISCONTINUED] simvastatin (ZOCOR) 20 MG tablet TAKE 1 TABLET(20 MG) BY MOUTH EVERY EVENING   losartan-hydrochlorothiazide (HYZAAR) 100-12.5 MG tablet Take 1 tablet by mouth daily.   simvastatin (ZOCOR) 20 MG tablet TAKE 1 TABLET(20 MG) BY MOUTH EVERY EVENING   No facility-administered encounter medications on file as of 11/01/2020.     Review of Systems  Constitutional:  Negative for chills and fever.  HENT:  Negative for congestion, rhinorrhea and sore throat.   Respiratory:  Negative for cough, shortness of breath and wheezing.   Cardiovascular:  Negative for chest pain and leg swelling.  Gastrointestinal:  Negative for abdominal pain, diarrhea, nausea and vomiting.  Genitourinary:  Negative for dysuria and frequency.  Skin:  Negative for rash.  Neurological:  Negative for dizziness, weakness and headaches.    Vitals BP 126/76   Pulse 100   Temp (!) 97.2 F (36.2 C)   Wt 188 lb 9.6 oz (85.5 kg)   SpO2 98%   BMI 30.44 kg/m   Objective:   Physical Exam Vitals and nursing note reviewed.  Constitutional:       General: He is not in acute distress.    Appearance: Normal appearance. He is not ill-appearing.  HENT:     Head: Normocephalic.     Nose: Nose normal. No congestion.     Mouth/Throat:     Mouth: Mucous membranes are moist.     Pharynx: No oropharyngeal exudate.  Eyes:     Extraocular Movements: Extraocular movements intact.     Conjunctiva/sclera: Conjunctivae normal.     Pupils: Pupils are equal, round, and reactive to light.  Cardiovascular:     Rate and Rhythm: Normal rate and regular rhythm.     Pulses: Normal pulses.     Heart sounds: Normal heart sounds. No murmur heard. Pulmonary:     Effort: Pulmonary effort is normal.     Breath sounds: Normal breath sounds. No wheezing, rhonchi or rales.  Musculoskeletal:        General: Normal range of motion.     Right lower leg: No edema.     Left lower leg: No edema.  Skin:    General: Skin is warm and dry.     Findings: No rash.  Neurological:     General: No focal deficit present.     Mental Status: He is alert and oriented to person, place, and time.     Cranial Nerves: No cranial nerve deficit.  Psychiatric:        Mood and Affect: Mood normal.  Behavior: Behavior normal.        Thought Content: Thought content normal.        Judgment: Judgment normal.     Assessment and Plan   1. Essential hypertension, benign - losartan-hydrochlorothiazide (HYZAAR) 100-12.5 MG tablet; Take 1 tablet by mouth daily.  Dispense: 90 tablet; Refill: 1  2. Hyperlipidemia LDL goal <130 - simvastatin (ZOCOR) 20 MG tablet; TAKE 1 TABLET(20 MG) BY MOUTH EVERY EVENING  Dispense: 90 tablet; Refill: 1  3. Impaired fasting glucose   Hyperlipidemia-stable, patient has not been on medication, patient stating he ran out of medications.  So we will restart this medication. Hypertension-stable, will continue medications. Impaired fasting glucose-stable, continue to watch carbs in diet.   Return in about 6 months (around 05/04/2021) for f/u  htn, hld.

## 2021-06-07 ENCOUNTER — Other Ambulatory Visit: Payer: Self-pay

## 2021-06-07 ENCOUNTER — Ambulatory Visit: Payer: Federal, State, Local not specified - PPO | Admitting: Family Medicine

## 2021-06-07 VITALS — BP 150/84 | HR 70 | Temp 98.4°F | Ht 66.0 in | Wt 190.6 lb

## 2021-06-07 DIAGNOSIS — Z130 Encounter for screening for diseases of the blood and blood-forming organs and certain disorders involving the immune mechanism: Secondary | ICD-10-CM | POA: Diagnosis not present

## 2021-06-07 DIAGNOSIS — I10 Essential (primary) hypertension: Secondary | ICD-10-CM | POA: Diagnosis not present

## 2021-06-07 DIAGNOSIS — R0789 Other chest pain: Secondary | ICD-10-CM | POA: Diagnosis not present

## 2021-06-07 DIAGNOSIS — E785 Hyperlipidemia, unspecified: Secondary | ICD-10-CM | POA: Diagnosis not present

## 2021-06-07 DIAGNOSIS — R7303 Prediabetes: Secondary | ICD-10-CM | POA: Diagnosis not present

## 2021-06-07 DIAGNOSIS — M549 Dorsalgia, unspecified: Secondary | ICD-10-CM | POA: Insufficient documentation

## 2021-06-07 MED ORDER — PANTOPRAZOLE SODIUM 40 MG PO TBEC: 40.0000 mg | DELAYED_RELEASE_TABLET | Freq: Every day | ORAL | 3 refills | Status: DC

## 2021-06-07 NOTE — Assessment & Plan Note (Signed)
EKG was obtained today and was independently reviewed by me.  Interpretation: Normal sinus rhythm with a rate of 69.  Normal axis.  Normal intervals.  No ST or T wave changes.  Normal EKG.  MSK versus GERD.  Placing on Protonix. ?

## 2021-06-07 NOTE — Assessment & Plan Note (Signed)
BP elevated today.  Patient has not taken his medication.  Continue losartan/HCTZ.  Labs today. ?

## 2021-06-07 NOTE — Assessment & Plan Note (Signed)
Lipid panel today.  Continue Zocor at current dosing for now.  May need dose increase based on lipid panel results. ?

## 2021-06-07 NOTE — Patient Instructions (Signed)
Labs today. ? ?Continue your medications. ? ?Use the Protonix daily. ? ?Take care ? ?Dr. Lacinda Axon  ?

## 2021-06-07 NOTE — Assessment & Plan Note (Signed)
Suspect that this is MSK in origin. ?

## 2021-06-07 NOTE — Progress Notes (Signed)
? ?Subjective:  ?Patient ID: Kyle Lane, male    DOB: 27-Nov-1960  Age: 61 y.o. MRN: [id number] ? ?CC: ?Chief Complaint  ?Patient presents with  ? Shoulder Pain  ?  In middle of shoulder blades.  Patient says thought it maybe indigestion and has taking Tums does help.  Pain goes a little more to the left side. Sometimes pain in also in front of chest.  ? ? ?HPI: ? ?61 year old male with hypertension, prediabetes, GERD, hyperlipidemia presents for follow-up.  ? ?Patient reports for the past 5 to 6 days he has had some discomfort in his upper back between his shoulder blades.  He is also had some discomfort in the center of his chest.  He states that he has had this previously and felt like it was related to GERD as it responded to Tums.  Has not really responded this time around.  He states he does work at [employer] and does a lot of activity.  He is unsure if this is contributing.  He is currently feeling well and has minimal discomfort.  No abdominal pain.  No shortness of breath. ? ?Patient has underlying hypertension.  Blood pressure is elevated today.  However, he has not taken his medication.  He states that he did not take his medication as he knew he was going to be getting blood work.  I advised him that in the future he can take his medication with a sip of water. ? ?Patient has hyperlipidemia.  Last LDL was 142.  Needs labs today.  Is currently on Zocor. ? ?Patient Active Problem List  ? Diagnosis Date Noted  ? Prediabetes 06/07/2021  ? Upper back pain 06/07/2021  ? Atypical chest pain 06/07/2021  ? Tubular adenoma 03/13/2020  ? Essential hypertension, benign 12/09/2012  ? Hyperlipidemia 12/09/2012  ? GERD 11/17/2008  ? ? ?Social Hx   ?Social History  ? ?Socioeconomic History  ? Marital status: Single  ?  Spouse name: Not on file  ? Number of children: Not on file  ? Years of education: Not on file  ? Highest education level: Not on file  ?Occupational History  ? Not on file  ?Tobacco Use   ? Smoking status: Never  ? Smokeless tobacco: Never  ?Substance and Sexual Activity  ? Alcohol use: Yes  ?  Comment: 1 beer per day  ? Drug use: Never  ? Sexual activity: Not on file  ?Other Topics Concern  ? Not on file  ?Social History Narrative  ? Not on file  ? ?Social Determinants of Health  ? ?Financial Resource Strain: Not on file  ?Food Insecurity: Not on file  ?Transportation Needs: Not on file  ?Physical Activity: Not on file  ?Stress: Not on file  ?Social Connections: Not on file  ? ? ?Review of Systems ?Per HPI ? ?Objective:  ?BP (!) 150/84   Pulse 70   Temp 98.4 ?F (36.9 ?C) (Oral)   Ht _0  (1.676 m)   Wt 190 lb 9.6 oz (86.5 kg)   SpO2 98%   BMI 30.76 kg/m?  ? ? ?  06/07/2021  ?  9:30 AM 11/01/2020  ?  9:54 AM 03/09/2020  ?  8:58 AM  ?BP/Weight  ?Systolic BP 149 702 637  ?Diastolic BP 84 76 69  ?Wt. (Lbs) 190.6 188.6   ?BMI 30.76 kg/m2 30.44 kg/m2   ? ? ?Physical Exam ?Vitals and nursing note reviewed.  ?Constitutional:   ?   General: He  is not in acute distress. ?   Appearance: Normal appearance. He is not ill-appearing.  ?HENT:  ?   Head: Normocephalic and atraumatic.  ?Eyes:  ?   General:     ?   Right eye: No discharge.     ?   Left eye: No discharge.  ?   Conjunctiva/sclera: Conjunctivae normal.  ?Cardiovascular:  ?   Rate and Rhythm: Normal rate and regular rhythm.  ?   Heart sounds: No murmur heard. ?Pulmonary:  ?   Effort: Pulmonary effort is normal.  ?   Breath sounds: Normal breath sounds. No wheezing, rhonchi or rales.  ?Chest:  ?   Chest wall: No tenderness.  ?Abdominal:  ?   General: There is no distension.  ?   Palpations: Abdomen is soft.  ?   Tenderness: There is no abdominal tenderness.  ?Neurological:  ?   Mental Status: He is alert.  ?Psychiatric:     ?   Mood and Affect: Mood normal.     ?   Behavior: Behavior normal.  ? ? ?Lab Results  ?Component Value Date  ? WBC 7.9 10/20/2020  ? HGB 14.3 10/20/2020  ? HCT 41.9 10/20/2020  ? PLT 257 10/20/2020  ? GLUCOSE 98 10/20/2020  ?  CHOL 211 (H) 10/20/2020  ? TRIG 125 10/20/2020  ? HDL 46 10/20/2020  ? LDLCALC 142 (H) 10/20/2020  ? ALT 19 10/20/2020  ? AST 25 10/20/2020  ? NA 139 10/20/2020  ? K 4.7 10/20/2020  ? CL 101 10/20/2020  ? CREATININE 1.09 10/20/2020  ? BUN 20 10/20/2020  ? CO2 24 10/20/2020  ? PSA 0.47 07/23/2013  ? HGBA1C 5.9 (H) 10/20/2020  ? ? ? ?Assessment & Plan:  ? ?Problem List Items Addressed This Visit   ? ?  ? Cardiovascular and Mediastinum  ? Essential hypertension, benign - Primary  ?  BP elevated today.  Patient has not taken his medication.  Continue losartan/HCTZ.  Labs today. ?  ?  ? Relevant Orders  ? CMP14+EGFR  ?  ? Other  ? Hyperlipidemia  ?  Lipid panel today.  Continue Zocor at current dosing for now.  May need dose increase based on lipid panel results. ?  ?  ? Relevant Orders  ? Lipid panel  ? Prediabetes  ? Relevant Orders  ? Hemoglobin A1c  ? Upper back pain  ?  Suspect that this is MSK in origin. ?  ?  ? Atypical chest pain  ?  EKG was obtained today and was independently reviewed by me.  Interpretation: Normal sinus rhythm with a rate of 69.  Normal axis.  Normal intervals.  No ST or T wave changes.  Normal EKG.  MSK versus GERD.  Placing on Protonix. ?  ?  ? Relevant Orders  ? EKG 12-Lead (Completed)  ? ?Other Visit Diagnoses   ? ? Screening for deficiency anemia      ? Relevant Orders  ? CBC  ? ?  ? ?Meds ordered this encounter  ?Medications  ? pantoprazole (PROTONIX) 40 MG tablet  ?  Sig: Take 1 tablet (40 mg total) by mouth daily.  ?  Dispense:  30 tablet  ?  Refill:  3  ? ?Follow-up:  6 months ? ?Thersa Salt DO ?[place] ? ?

## 2021-06-08 LAB — LIPID PANEL
Chol/HDL Ratio: 3.5 ratio (ref 0.0–5.0)
Cholesterol, Total: 177 mg/dL (ref 100–199)
HDL: 50 mg/dL (ref >39)
LDL Chol Calc (NIH): 115 mg/dL — ABNORMAL HIGH (ref 0–99)
Triglycerides: 62 mg/dL (ref 0–149)
VLDL Cholesterol Cal: 12 mg/dL (ref 5–40)

## 2021-06-08 LAB — CBC
Hematocrit: 42.6 % (ref 37.5–51.0)
Hemoglobin: 14.8 g/dL (ref 13.0–17.7)
MCH: 33.4 pg — ABNORMAL HIGH (ref 26.6–33.0)
MCHC: 34.7 g/dL (ref 31.5–35.7)
MCV: 96 fL (ref 79–97)
Platelets: 240 10*3/uL (ref 150–450)
RBC: 4.43 x10E6/uL (ref 4.14–5.80)
RDW: 11.8 % (ref 11.6–15.4)
WBC: 7.7 10*3/uL (ref 3.4–10.8)

## 2021-06-08 LAB — CMP14+EGFR
ALT: 18 IU/L (ref 0–44)
AST: 23 IU/L (ref 0–40)
Albumin/Globulin Ratio: 1.7 (ref 1.2–2.2)
Albumin: 4.3 g/dL (ref 3.8–4.8)
Alkaline Phosphatase: 79 IU/L (ref 44–121)
BUN/Creatinine Ratio: 17 (ref 10–24)
BUN: 18 mg/dL (ref 8–27)
Bilirubin Total: 0.3 mg/dL (ref 0.0–1.2)
CO2: 24 mmol/L (ref 20–29)
Calcium: 9.3 mg/dL (ref 8.6–10.2)
Chloride: 103 mmol/L (ref 96–106)
Creatinine, Ser: 1.08 mg/dL (ref 0.76–1.27)
Globulin, Total: 2.6 g/dL (ref 1.5–4.5)
Glucose: 110 mg/dL — ABNORMAL HIGH (ref 70–99)
Potassium: 4.8 mmol/L (ref 3.5–5.2)
Sodium: 138 mmol/L (ref 134–144)
Total Protein: 6.9 g/dL (ref 6.0–8.5)
eGFR: 78 mL/min/{1.73_m2} (ref >59)

## 2021-06-08 LAB — HEMOGLOBIN A1C
Est. average glucose Bld gHb Est-mCnc: 117 mg/dL
Hgb A1c MFr Bld: 5.7 % — ABNORMAL HIGH (ref 4.8–5.6)

## 2021-06-15 MED ORDER — SIMVASTATIN 40 MG PO TABS: 40.0000 mg | ORAL_TABLET | Freq: Every day | ORAL | 1 refills | Status: DC

## 2021-06-15 NOTE — Addendum Note (Signed)
Addended by: Dairl Ponder on: 06/15/2021 03:33 PM ? ? Modules accepted: Orders ? ?

## 2021-07-26 ENCOUNTER — Other Ambulatory Visit: Payer: Self-pay | Admitting: Family Medicine

## 2021-07-26 DIAGNOSIS — I10 Essential (primary) hypertension: Secondary | ICD-10-CM

## 2022-01-02 ENCOUNTER — Other Ambulatory Visit: Payer: Self-pay | Admitting: Family Medicine

## 2022-02-09 ENCOUNTER — Other Ambulatory Visit: Payer: Self-pay | Admitting: Family Medicine

## 2022-02-09 DIAGNOSIS — I10 Essential (primary) hypertension: Secondary | ICD-10-CM

## 2022-03-14 IMAGING — DX DG CHEST 1V PORT
1 series · 1 of 1 positions shown · non-contrast
Comparison: No pertinent prior exams are available for comparison.

CLINICAL DATA: Provided history: Patient states cough for 2 weeks,
body aches for 2 days, fever and borderline tachycardia noted in
triage, oxygen saturation 94.

EXAM:
PORTABLE CHEST 1 VIEW

[chest ap]
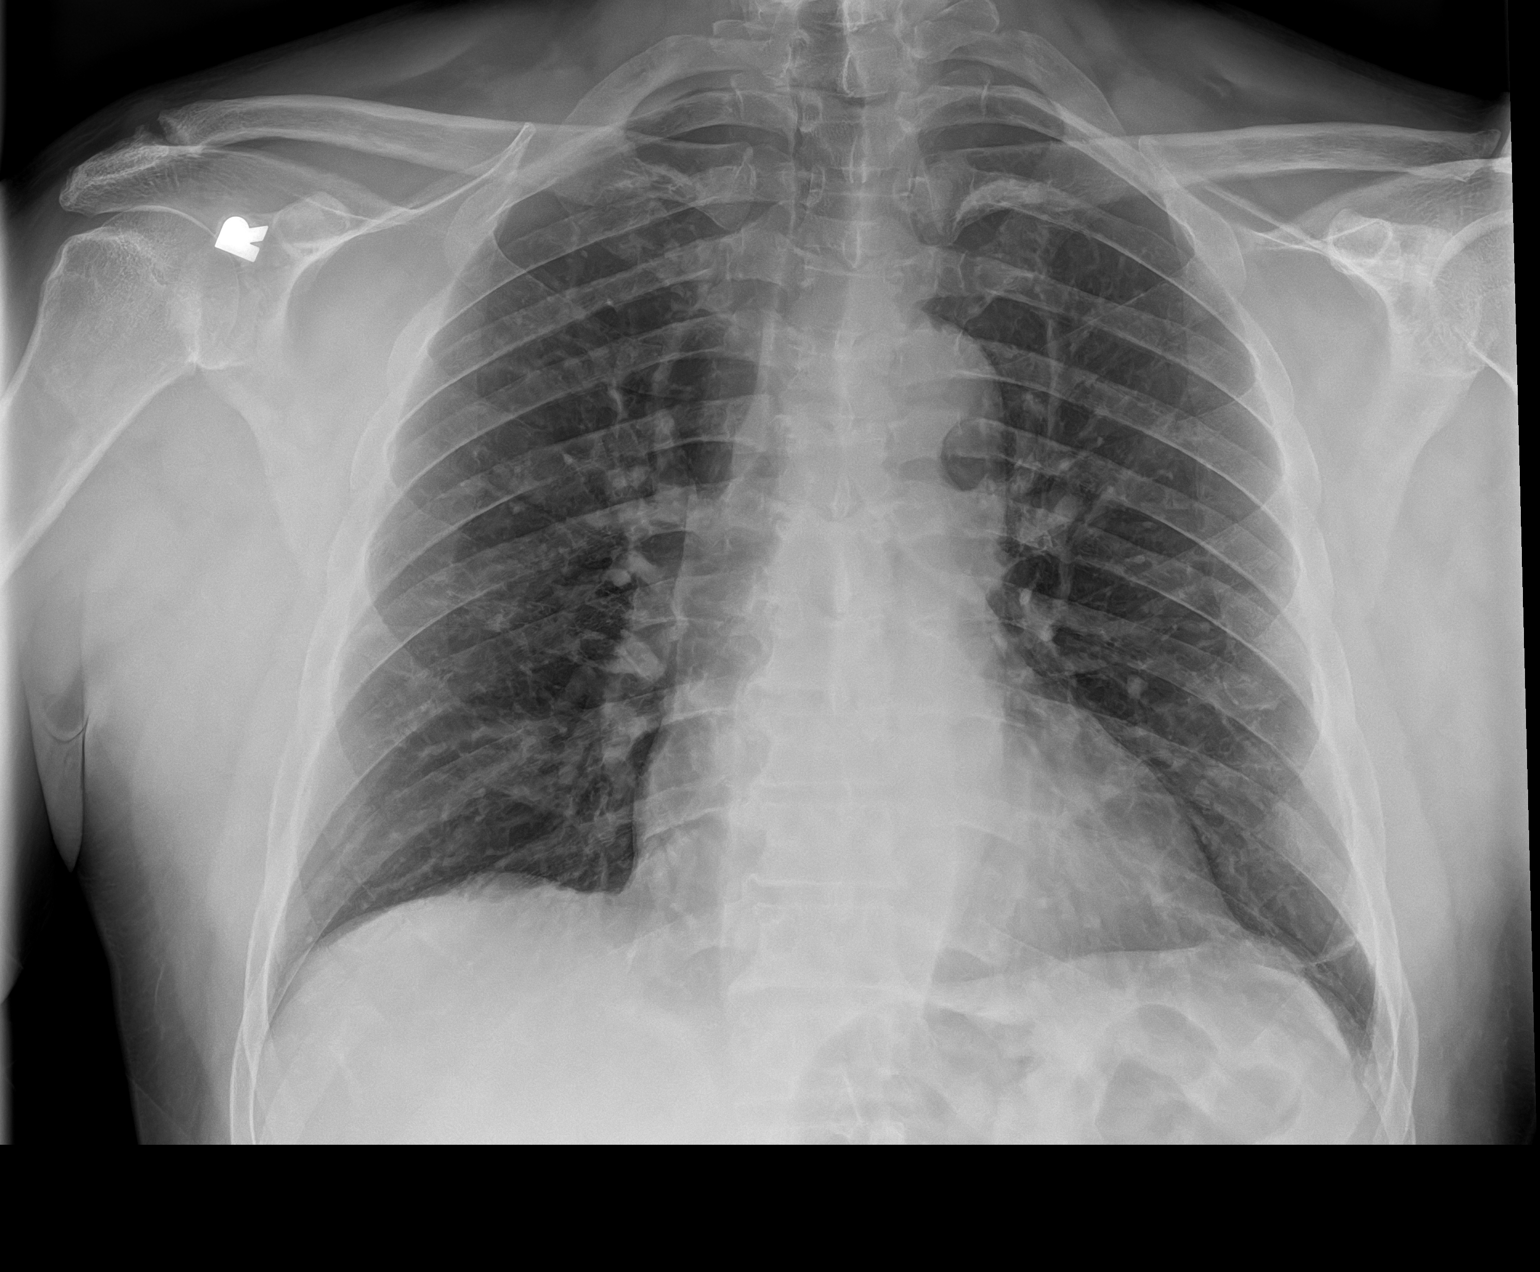

[1 of 1 positions shown; findings below may reference images not displayed]

FINDINGS: Heart size within normal limits. There is minimal linear atelectasis
or scarring within the left lung base. Mild scarring is also noted
within the lung apices. No appreciable airspace consolidation or
pulmonary edema. No evidence of pleural effusion or pneumothorax. No
acute bony abnormality identified.
IMPRESSION: Minimal linear atelectasis or scarring within the left lung base.

Mild scarring is also noted within the lung apices.

No appreciable airspace consolidation or pulmonary edema.

## 2022-05-30 ENCOUNTER — Encounter: Payer: Self-pay | Admitting: Family Medicine

## 2022-05-30 ENCOUNTER — Ambulatory Visit (INDEPENDENT_AMBULATORY_CARE_PROVIDER_SITE_OTHER): Payer: Federal, State, Local not specified - PPO | Admitting: Family Medicine

## 2022-05-30 VITALS — BP 131/87 | HR 76 | Temp 97.3°F | Ht 66.0 in | Wt 183.0 lb

## 2022-05-30 DIAGNOSIS — Z130 Encounter for screening for diseases of the blood and blood-forming organs and certain disorders involving the immune mechanism: Secondary | ICD-10-CM | POA: Diagnosis not present

## 2022-05-30 DIAGNOSIS — I10 Essential (primary) hypertension: Secondary | ICD-10-CM | POA: Diagnosis not present

## 2022-05-30 DIAGNOSIS — K219 Gastro-esophageal reflux disease without esophagitis: Secondary | ICD-10-CM | POA: Diagnosis not present

## 2022-05-30 DIAGNOSIS — Z0001 Encounter for general adult medical examination with abnormal findings: Secondary | ICD-10-CM | POA: Diagnosis not present

## 2022-05-30 DIAGNOSIS — Z125 Encounter for screening for malignant neoplasm of prostate: Secondary | ICD-10-CM

## 2022-05-30 DIAGNOSIS — Z1159 Encounter for screening for other viral diseases: Secondary | ICD-10-CM

## 2022-05-30 DIAGNOSIS — Z0000 Encounter for general adult medical examination without abnormal findings: Secondary | ICD-10-CM

## 2022-05-30 DIAGNOSIS — Z114 Encounter for screening for human immunodeficiency virus [HIV]: Secondary | ICD-10-CM

## 2022-05-30 DIAGNOSIS — E785 Hyperlipidemia, unspecified: Secondary | ICD-10-CM

## 2022-05-30 DIAGNOSIS — R7303 Prediabetes: Secondary | ICD-10-CM

## 2022-05-30 MED ORDER — LOSARTAN POTASSIUM-HCTZ 100-12.5 MG PO TABS: 1.0000 | ORAL_TABLET | Freq: Every day | ORAL | 3 refills | Status: DC

## 2022-05-30 MED ORDER — SIMVASTATIN 40 MG PO TABS: ORAL_TABLET | ORAL | 3 refills | Status: DC

## 2022-05-30 MED ORDER — PANTOPRAZOLE SODIUM 40 MG PO TBEC: 40.0000 mg | DELAYED_RELEASE_TABLET | Freq: Every day | ORAL | 3 refills | Status: AC | PRN

## 2022-05-30 NOTE — Progress Notes (Signed)
Subjective:  Patient ID: Kyle Lane, male    DOB: 28-Jun-1960  Age: 62 y.o. MRN: [id number]  CC: Chief Complaint  Patient presents with   Annual Exam    HPI:  62 year old male with the below mentioned medical problems presents for an annual physical exam.  Patient states that he is doing well.  He states that he no longer has the Protonix due to insurance issues but states that it worked great.  He would like an additional prescription to be used as needed for GERD.  Denies chest pain or shortness of breath.  He does report occasional urinary urgency.  No significant nocturia.  In regards to his preventative health care, patient is amendable to HIV and hepatitis C screening.  In need of tetanus.  Declines COVID-vaccine.  Has declined influenza and shingles vaccines.  Colonoscopy up-to-date.  Patient Active Problem List   Diagnosis Date Noted   Annual physical exam 05/30/2022   Prediabetes 06/07/2021   Tubular adenoma 03/13/2020   Essential hypertension, benign 12/09/2012   Hyperlipidemia 12/09/2012   GERD 11/17/2008    Social Hx   Social History   Socioeconomic History   Marital status: Single    Spouse name: Not on file   Number of children: Not on file   Years of education: Not on file   Highest education level: Not on file  Occupational History   Not on file  Tobacco Use   Smoking status: Never   Smokeless tobacco: Never  Substance and Sexual Activity   Alcohol use: Yes    Comment: 1 beer per day   Drug use: Never   Sexual activity: Not on file  Other Topics Concern   Not on file  Social History Narrative   Not on file   Social Determinants of Health   Financial Resource Strain: Not on file  Food Insecurity: Not on file  Transportation Needs: Not on file  Physical Activity: Not on file  Stress: Not on file  Social Connections: Not on file    Review of Systems Per HPI  Objective:  BP 131/87   Pulse 76   Temp (!) 97.3 F (36.3 C)   Ht  '5\' 6"'$  (1.676 m)   Wt 183 lb (83 kg)   SpO2 96%   BMI 29.54 kg/m      05/30/2022    9:08 AM 06/07/2021    9:30 AM 11/01/2020    9:54 AM  BP/Weight  Systolic BP [id number]  Diastolic BP 87 84 76  Wt. (Lbs) 183 190.6 188.6  BMI 29.54 kg/m2 30.76 kg/m2 30.44 kg/m2    Physical Exam Vitals and nursing note reviewed.  Constitutional:      General: He is not in acute distress.    Appearance: Normal appearance.  HENT:     Head: Normocephalic and atraumatic.  Eyes:     General:        Right eye: No discharge.        Left eye: No discharge.     Conjunctiva/sclera: Conjunctivae normal.  Cardiovascular:     Rate and Rhythm: Normal rate and regular rhythm.  Pulmonary:     Effort: Pulmonary effort is normal.     Breath sounds: Normal breath sounds. No wheezing, rhonchi or rales.  Neurological:     Mental Status: He is alert.  Psychiatric:        Mood and Affect: Mood normal.        Behavior: Behavior normal.  Lab Results  Component Value Date   WBC 7.7 06/07/2021   HGB 14.8 06/07/2021   HCT 42.6 06/07/2021   PLT 240 06/07/2021   GLUCOSE 110 (H) 06/07/2021   CHOL 177 06/07/2021   TRIG 62 06/07/2021   HDL 50 06/07/2021   LDLCALC 115 (H) 06/07/2021   ALT 18 06/07/2021   AST 23 06/07/2021   NA 138 06/07/2021   K 4.8 06/07/2021   CL 103 06/07/2021   CREATININE 1.08 06/07/2021   BUN 18 06/07/2021   CO2 24 06/07/2021   PSA 0.47 07/23/2013   HGBA1C 5.7 (H) 06/07/2021     Assessment & Plan:   Problem List Items Addressed This Visit       Cardiovascular and Mediastinum   Essential hypertension, benign   Relevant Medications   losartan-hydrochlorothiazide (HYZAAR) 100-12.5 MG tablet   simvastatin (ZOCOR) 40 MG tablet   Other Relevant Orders   CMP14+EGFR     Digestive   GERD   Relevant Medications   pantoprazole (PROTONIX) 40 MG tablet     Other   Hyperlipidemia   Relevant Medications   losartan-hydrochlorothiazide (HYZAAR) 100-12.5 MG tablet    simvastatin (ZOCOR) 40 MG tablet   Other Relevant Orders   Lipid panel   Prediabetes   Relevant Orders   Hemoglobin A1c   Annual physical exam - Primary    Labs today including HIV and Hep C. Patient's medications refilled. Colonoscopy up-to-date. Follow-up in 6 months.      Other Visit Diagnoses     Screening for deficiency anemia       Relevant Orders   CBC   Prostate cancer screening       Relevant Orders   PSA   Encounter for screening for HIV       Relevant Orders   HIV antibody (with reflex)   Need for hepatitis C screening test       Relevant Orders   Hepatitis C Antibody       Meds ordered this encounter  Medications   losartan-hydrochlorothiazide (HYZAAR) 100-12.5 MG tablet    Sig: Take 1 tablet by mouth daily.    Dispense:  90 tablet    Refill:  3   simvastatin (ZOCOR) 40 MG tablet    Sig: TAKE 1 TABLET(40 MG) BY MOUTH AT BEDTIME    Dispense:  90 tablet    Refill:  3   pantoprazole (PROTONIX) 40 MG tablet    Sig: Take 1 tablet (40 mg total) by mouth daily as needed (GERD/Heartburn).    Dispense:  30 tablet    Refill:  3    Follow-up:  Return in about 6 months (around 11/30/2022).  [place]

## 2022-05-30 NOTE — Assessment & Plan Note (Signed)
Labs today including HIV and Hep C. Patient's medications refilled. Colonoscopy up-to-date. Follow-up in 6 months.

## 2022-05-30 NOTE — Patient Instructions (Addendum)
Labs today.  Medications refilled.  Follow up in 6 months.

## 2022-06-11 ENCOUNTER — Telehealth: Payer: Self-pay | Admitting: *Deleted

## 2022-06-11 NOTE — Telephone Encounter (Signed)
PA for Protonix denied by insurance: Plan states it will only pay for a total of 90 days worth of any PPI(in total) in a 365 day period

## 2022-08-15 ENCOUNTER — Ambulatory Visit: Payer: Federal, State, Local not specified - PPO | Admitting: Family Medicine

## 2022-08-15 ENCOUNTER — Encounter: Payer: Self-pay | Admitting: Family Medicine

## 2022-08-15 ENCOUNTER — Telehealth: Payer: Self-pay | Admitting: Family Medicine

## 2022-08-15 VITALS — BP 148/96 | HR 70 | Ht 66.0 in | Wt 187.8 lb

## 2022-08-15 DIAGNOSIS — S70362A Insect bite (nonvenomous), left thigh, initial encounter: Secondary | ICD-10-CM | POA: Diagnosis not present

## 2022-08-15 DIAGNOSIS — R42 Dizziness and giddiness: Secondary | ICD-10-CM | POA: Diagnosis not present

## 2022-08-15 DIAGNOSIS — I10 Essential (primary) hypertension: Secondary | ICD-10-CM | POA: Diagnosis not present

## 2022-08-15 DIAGNOSIS — W57XXXA Bitten or stung by nonvenomous insect and other nonvenomous arthropods, initial encounter: Secondary | ICD-10-CM | POA: Diagnosis not present

## 2022-08-15 MED ORDER — SIMVASTATIN 40 MG PO TABS: ORAL_TABLET | ORAL | 3 refills | Status: DC

## 2022-08-15 NOTE — Patient Instructions (Signed)

## 2022-08-15 NOTE — Progress Notes (Signed)
   Subjective:    Patient ID: Kyle Lane, male    DOB: Nov 12, 1960, 62 y.o.   MRN: [id number]  HPI Patient arrives today for vertigo. Patient not sure if they are associated with the three tick bites.  Patient with multiple tick bites 1 on the thigh 1 on the side Has had some intermittent dizziness that he describes as feeling lightheaded.  No true room spinning or nausea or vomiting No unilateral numbness or weakness No headache No blurred vision Symptoms present off and on over the past few days denies any high fever body aches fever chills sweats no unusual rashes   Review of Systems     Objective:   Physical Exam General-in no acute distress Eyes-no discharge Lungs-respiratory rate normal, CTA CV-no murmurs,RRR Extremities skin warm dry no edema Neuro grossly normal Behavior normal, alert        Assessment & Plan:  1. Essential hypertension, benign Blood pressure not under good control.  Minimize salt in diet.  Consuming too much salt.  Regular physical activity recommended including 20 to 30 minutes of walking 4 times a week plus portion control and avoiding salty foods DASHDiet also on after visit summary  2. Dizziness Intermittent dizziness no unilateral numbness or weakness no ataxia seems to be more of the inner ear sensation but also possibility could be related to blood pressure I find no evidence of a stroke warnings discussed  3. Tick bite of left thigh, initial encounter No red area where the tick bite occurred a few days ago just at the site of the tick bite there is no sign of any cellulitis [facility] spotted fever or Lyme's disease currently warning signs discussed

## 2022-08-15 NOTE — Telephone Encounter (Signed)
Refill on simvastatin (ZOCOR) 40 MG tablet  to [facility]

## 2022-09-11 ENCOUNTER — Ambulatory Visit: Payer: Federal, State, Local not specified - PPO | Admitting: Family Medicine

## 2022-09-11 VITALS — BP 151/91 | HR 76 | Temp 98.1°F | Ht 66.0 in | Wt 190.2 lb

## 2022-09-11 DIAGNOSIS — R7303 Prediabetes: Secondary | ICD-10-CM | POA: Diagnosis not present

## 2022-09-11 DIAGNOSIS — Z125 Encounter for screening for malignant neoplasm of prostate: Secondary | ICD-10-CM | POA: Diagnosis not present

## 2022-09-11 DIAGNOSIS — Z130 Encounter for screening for diseases of the blood and blood-forming organs and certain disorders involving the immune mechanism: Secondary | ICD-10-CM | POA: Diagnosis not present

## 2022-09-11 DIAGNOSIS — I10 Essential (primary) hypertension: Secondary | ICD-10-CM | POA: Diagnosis not present

## 2022-09-11 DIAGNOSIS — E785 Hyperlipidemia, unspecified: Secondary | ICD-10-CM | POA: Diagnosis not present

## 2022-09-11 DIAGNOSIS — Z114 Encounter for screening for human immunodeficiency virus [HIV]: Secondary | ICD-10-CM

## 2022-09-11 DIAGNOSIS — Z1159 Encounter for screening for other viral diseases: Secondary | ICD-10-CM

## 2022-09-11 MED ORDER — LOSARTAN POTASSIUM-HCTZ 100-25 MG PO TABS: 1.0000 | ORAL_TABLET | Freq: Every day | ORAL | 3 refills | Status: DC

## 2022-09-11 NOTE — Patient Instructions (Signed)
Labs today.  I have increased your medication.  Follow-up in 6 weeks.

## 2022-09-11 NOTE — Assessment & Plan Note (Signed)
Uncontrolled/worsening.  Increasing dosing of losartan/HCTZ.  Labs today.

## 2022-09-11 NOTE — Progress Notes (Signed)
Subjective:  Patient ID: Kyle Lane, male    DOB: [birth year]  Age: 62 y.o. MRN: [id number]  CC: Chief Complaint  Patient presents with   Follow-up   Hypertension    HPI:  62 year old male presents for follow-up regarding hypertension.  Previous visit with Dr. Gerda Diss revealed elevated BP.  He has been watching his diet.  Blood pressure remains elevated.  Elevated here today.  He endorses compliance with losartan/HCTZ.  Denies chest pain or shortness of breath.  He has no other complaints or concerns at this time.  He did not get his previous labs drawn.  Needs labs.  Patient Active Problem List   Diagnosis Date Noted   Annual physical exam 05/30/2022   Prediabetes 06/07/2021   Tubular adenoma 03/13/2020   Essential hypertension, benign 12/09/2012   Hyperlipidemia 12/09/2012   GERD 11/17/2008    Social Hx   Social History   Socioeconomic History   Marital status: Married    Spouse name: Not on file   Number of children: Not on file   Years of education: Not on file   Highest education level: Not on file  Occupational History   Not on file  Tobacco Use   Smoking status: Never   Smokeless tobacco: Never  Substance and Sexual Activity   Alcohol use: Yes    Comment: 1 beer per day   Drug use: Never   Sexual activity: Not on file  Other Topics Concern   Not on file  Social History Narrative   Not on file   Social Determinants of Health   Financial Resource Strain: Not on file  Food Insecurity: Not on file  Transportation Needs: Not on file  Physical Activity: Not on file  Stress: Not on file  Social Connections: Not on file    Review of Systems Per HPI  Objective:  BP (!) 151/91   Pulse 76   Temp 98.1 F (36.7 C)   Ht 5\' 6"  (1.676 m)   Wt 190 lb 3.2 oz (86.3 kg)   SpO2 99%   BMI 30.70 kg/m      09/11/2022   10:58 AM 09/11/2022   10:41 AM 08/15/2022   12:51 PM  BP/Weight  Systolic BP 151 149 148  Diastolic BP 91 93 96  Wt. (Lbs)   190.2   BMI  30.7 kg/m2     Physical Exam Vitals and nursing note reviewed.  Constitutional:      General: He is not in acute distress.    Appearance: Normal appearance.  HENT:     Head: Normocephalic and atraumatic.  Eyes:     General:        Right eye: No discharge.        Left eye: No discharge.     Conjunctiva/sclera: Conjunctivae normal.  Cardiovascular:     Rate and Rhythm: Normal rate and regular rhythm.  Pulmonary:     Effort: Pulmonary effort is normal.     Breath sounds: Normal breath sounds. No wheezing, rhonchi or rales.  Neurological:     Mental Status: He is alert.  Psychiatric:        Mood and Affect: Mood normal.        Behavior: Behavior normal.     Lab Results  Component Value Date   WBC 7.7 06/07/2021   HGB 14.8 06/07/2021   HCT 42.6 06/07/2021   PLT 240 06/07/2021   GLUCOSE 110 (H) 06/07/2021   CHOL 177 06/07/2021   TRIG  62 06/07/2021   HDL 50 06/07/2021   LDLCALC 115 (H) 06/07/2021   ALT 18 06/07/2021   AST 23 06/07/2021   NA 138 06/07/2021   K 4.8 06/07/2021   CL 103 06/07/2021   CREATININE 1.08 06/07/2021   BUN 18 06/07/2021   CO2 24 06/07/2021   PSA 0.47 07/23/2013   HGBA1C 5.7 (H) 06/07/2021     Assessment & Plan:   Problem List Items Addressed This Visit       Cardiovascular and Mediastinum   Essential hypertension, benign - Primary    Uncontrolled/worsening.  Increasing dosing of losartan/HCTZ.  Labs today.      Relevant Medications   losartan-hydrochlorothiazide (HYZAAR) 100-25 MG tablet   Other Relevant Orders   CMP14+EGFR   Microalbumin / creatinine urine ratio     Other   Prediabetes   Relevant Orders   Hemoglobin A1c   Lipid panel   Microalbumin / creatinine urine ratio   Hyperlipidemia   Relevant Medications   losartan-hydrochlorothiazide (HYZAAR) 100-25 MG tablet   Other Visit Diagnoses     Screening for deficiency anemia       Relevant Orders   CBC   Screening PSA (prostate specific antigen)        Relevant Orders   PSA   Screening for HIV (human immunodeficiency virus)       Relevant Orders   HIV antibody (with reflex)   Encounter for hepatitis C screening test for low risk patient       Relevant Orders   Hepatitis C Antibody       Meds ordered this encounter  Medications   losartan-hydrochlorothiazide (HYZAAR) 100-25 MG tablet    Sig: Take 1 tablet by mouth daily.    Dispense:  90 tablet    Refill:  3    Follow-up:  Return in about 6 weeks (around 10/23/2022) for HTN follow up.  Everlene Other DO [facility]

## 2022-09-12 ENCOUNTER — Telehealth: Payer: Self-pay

## 2022-09-12 LAB — LIPID PANEL
Chol/HDL Ratio: 3.1 ratio (ref 0.0–5.0)
Cholesterol, Total: 173 mg/dL (ref 100–199)
HDL: 56 mg/dL (ref >39)
LDL Chol Calc (NIH): 99 mg/dL (ref 0–99)
Triglycerides: 99 mg/dL (ref 0–149)
VLDL Cholesterol Cal: 18 mg/dL (ref 5–40)

## 2022-09-12 LAB — CBC
Hematocrit: 44.1 % (ref 37.5–51.0)
Hemoglobin: 15.1 g/dL (ref 13.0–17.7)
MCH: 33.3 pg — ABNORMAL HIGH (ref 26.6–33.0)
MCHC: 34.2 g/dL (ref 31.5–35.7)
MCV: 97 fL (ref 79–97)
Platelets: 256 10*3/uL (ref 150–450)
RBC: 4.54 x10E6/uL (ref 4.14–5.80)
RDW: 12 % (ref 11.6–15.4)
WBC: 8.6 10*3/uL (ref 3.4–10.8)

## 2022-09-12 LAB — CMP14+EGFR
ALT: 19 IU/L (ref 0–44)
AST: 21 IU/L (ref 0–40)
Albumin: 4.5 g/dL (ref 3.9–4.9)
Alkaline Phosphatase: 68 IU/L (ref 44–121)
BUN/Creatinine Ratio: 12 (ref 10–24)
BUN: 13 mg/dL (ref 8–27)
Bilirubin Total: 0.5 mg/dL (ref 0.0–1.2)
CO2: 24 mmol/L (ref 20–29)
Calcium: 9.7 mg/dL (ref 8.6–10.2)
Chloride: 103 mmol/L (ref 96–106)
Creatinine, Ser: 1.09 mg/dL (ref 0.76–1.27)
Globulin, Total: 2.9 g/dL (ref 1.5–4.5)
Glucose: 103 mg/dL — ABNORMAL HIGH (ref 70–99)
Potassium: 4.7 mmol/L (ref 3.5–5.2)
Sodium: 143 mmol/L (ref 134–144)
Total Protein: 7.4 g/dL (ref 6.0–8.5)
eGFR: 77 mL/min/{1.73_m2} (ref >59)

## 2022-09-12 LAB — HIV ANTIBODY (ROUTINE TESTING W REFLEX): HIV Screen 4th Generation wRfx: NONREACTIVE

## 2022-09-12 LAB — MICROALBUMIN / CREATININE URINE RATIO
Creatinine, Urine: 100.8 mg/dL
Microalb/Creat Ratio: 4 mg/g creat (ref 0–29)
Microalbumin, Urine: 4.4 ug/mL

## 2022-09-12 LAB — HEMOGLOBIN A1C
Est. average glucose Bld gHb Est-mCnc: 123 mg/dL
Hgb A1c MFr Bld: 5.9 % — ABNORMAL HIGH (ref 4.8–5.6)

## 2022-09-12 LAB — HEPATITIS C ANTIBODY: Hep C Virus Ab: NONREACTIVE

## 2022-09-12 LAB — PSA: Prostate Specific Ag, Serum: 0.5 ng/mL (ref 0.0–4.0)

## 2022-09-12 NOTE — Telephone Encounter (Signed)
Called PT VM was not set up yet.

## 2022-10-23 ENCOUNTER — Ambulatory Visit: Payer: Federal, State, Local not specified - PPO | Admitting: Family Medicine

## 2022-10-23 VITALS — BP 127/79 | HR 85 | Temp 97.7°F | Ht 66.0 in | Wt 188.6 lb

## 2022-10-23 DIAGNOSIS — I10 Essential (primary) hypertension: Secondary | ICD-10-CM | POA: Diagnosis not present

## 2022-10-23 NOTE — Patient Instructions (Signed)
1 lab draw today.  Follow up in 6 months.  Take care  Dr. Adriana Simas

## 2022-10-23 NOTE — Progress Notes (Signed)
Subjective:  Patient ID: Kyle Lane, male    DOB: 12-09-1960  Age: 62 y.o. MRN: [id number]  CC: Follow up HTN  HPI:  62 year old male presents for follow-up.  After dose increase in losartan/HCTZ, his blood pressure is now well-controlled.  He is feeling well.  He is compliant with treatment.  Needs metabolic panel to reassess renal function and potassium after dose increase.  Patient Active Problem List   Diagnosis Date Noted   Prediabetes 06/07/2021   Tubular adenoma 03/13/2020   Essential hypertension, benign 12/09/2012   Hyperlipidemia 12/09/2012   GERD 11/17/2008    Social Hx   Social History   Socioeconomic History   Marital status: Married    Spouse name: Not on file   Number of children: Not on file   Years of education: Not on file   Highest education level: 12th grade  Occupational History   Not on file  Tobacco Use   Smoking status: Never   Smokeless tobacco: Never  Substance and Sexual Activity   Alcohol use: Yes    Comment: 1 beer per day   Drug use: Never   Sexual activity: Not on file  Other Topics Concern   Not on file  Social History Narrative   Not on file   Social Determinants of Health   Financial Resource Strain: Low Risk  (10/23/2022)   Overall Financial Resource Strain (CARDIA)    Difficulty of Paying Living Expenses: Not hard at all  Food Insecurity: No Food Insecurity (10/23/2022)   Hunger Vital Sign    Worried About Running Out of Food in the Last Year: Never true    Ran Out of Food in the Last Year: Never true  Transportation Needs: No Transportation Needs (10/23/2022)   PRAPARE - Administrator, Civil Service (Medical): No    Lack of Transportation (Non-Medical): No  Physical Activity: Insufficiently Active (10/23/2022)   Exercise Vital Sign    Days of Exercise per Week: 3 days    Minutes of Exercise per Session: 30 min  Stress: No Stress Concern Present (10/23/2022)   [employer] of Occupational Health -  Occupational Stress Questionnaire    Feeling of Stress : Not at all  Social Connections: Moderately Integrated (10/23/2022)   Social Connection and Isolation Panel [NHANES]    Frequency of Communication with Friends and Family: More than three times a week    Frequency of Social Gatherings with Friends and Family: More than three times a week    Attends Religious Services: 1 to 4 times per year    Active Member of [employer] or Organizations: No    Attends Engineer, structural: Not on file    Marital Status: Married    Review of Systems  Constitutional: Negative.   Respiratory: Negative.    Cardiovascular: Negative.      Objective:  BP 127/79   Pulse 85   Temp 97.7 F (36.5 C)   Ht 5\' 6"  (1.676 m)   Wt 188 lb 9.6 oz (85.5 kg)   SpO2 97%   BMI 30.44 kg/m      10/23/2022   10:14 AM 09/11/2022   10:58 AM 09/11/2022   10:41 AM  BP/Weight  Systolic BP 127 151 149  Diastolic BP 79 91 93  Wt. (Lbs) 188.6  190.2  BMI 30.44 kg/m2  30.7 kg/m2    Physical Exam Vitals and nursing note reviewed.  Constitutional:      General: He is not in  acute distress.    Appearance: Normal appearance.  HENT:     Head: Normocephalic and atraumatic.  Eyes:     General:        Right eye: No discharge.        Left eye: No discharge.     Conjunctiva/sclera: Conjunctivae normal.  Cardiovascular:     Rate and Rhythm: Normal rate and regular rhythm.  Pulmonary:     Effort: Pulmonary effort is normal.     Breath sounds: Normal breath sounds. No wheezing, rhonchi or rales.  Neurological:     Mental Status: He is alert.  Psychiatric:        Mood and Affect: Mood normal.        Behavior: Behavior normal.     Lab Results  Component Value Date   WBC 8.6 09/11/2022   HGB 15.1 09/11/2022   HCT 44.1 09/11/2022   PLT 256 09/11/2022   GLUCOSE 103 (H) 09/11/2022   CHOL 173 09/11/2022   TRIG 99 09/11/2022   HDL 56 09/11/2022   LDLCALC 99 09/11/2022   ALT 19 09/11/2022   AST 21 09/11/2022    NA 143 09/11/2022   K 4.7 09/11/2022   CL 103 09/11/2022   CREATININE 1.09 09/11/2022   BUN 13 09/11/2022   CO2 24 09/11/2022   PSA 0.47 07/23/2013   HGBA1C 5.9 (H) 09/11/2022     Assessment & Plan:   Problem List Items Addressed This Visit       Cardiovascular and Mediastinum   Essential hypertension, benign - Primary    At goal.  Continue losartan/HCTZ at current dose.  Metabolic panel ordered.      Relevant Orders   Basic Metabolic Panel    Follow-up:  Return in about 6 months (around 04/25/2023).  Everlene Other DO [facility]

## 2022-10-23 NOTE — Assessment & Plan Note (Signed)
At goal.  Continue losartan/HCTZ at current dose.  Metabolic panel ordered.

## 2022-11-30 ENCOUNTER — Ambulatory Visit: Payer: Federal, State, Local not specified - PPO | Admitting: Family Medicine

## 2023-04-25 ENCOUNTER — Ambulatory Visit: Payer: Federal, State, Local not specified - PPO | Admitting: Family Medicine

## 2023-10-10 ENCOUNTER — Other Ambulatory Visit: Payer: Self-pay | Admitting: Family Medicine

## 2023-10-13 ENCOUNTER — Other Ambulatory Visit: Payer: Self-pay | Admitting: Family Medicine

## 2023-10-14 ENCOUNTER — Other Ambulatory Visit: Payer: Self-pay

## 2023-10-14 MED ORDER — LOSARTAN POTASSIUM-HCTZ 100-25 MG PO TABS: 1.0000 | ORAL_TABLET | Freq: Every day | ORAL | 3 refills | Status: AC

## 2024-01-13 ENCOUNTER — Ambulatory Visit: Payer: Self-pay

## 2024-01-13 NOTE — Telephone Encounter (Signed)
 FYI Only or Action Required?: FYI only for provider.  Patient was last seen in primary care on 10/23/2022 by Cook, Jayce G, DO.  Called Nurse Triage reporting Dizziness.  Symptoms began several days ago.  Interventions attempted: Nothing.  Symptoms are: stable.  Triage Disposition: See PCP When Office is Open (Within 3 Days)  Patient/caregiver understands and will follow disposition?: Yes Reason for Disposition  [1] MILD dizziness (e.g., vertigo; walking normally) AND [2] has NOT been evaluated by doctor (or NP/PA) for this  Answer Assessment - Initial Assessment Questions Takes BP medication daily, does not have BP monitor at home. States in the left ear it feels something, no pain, can't describe it, denies diminished hearing in that ear.   1. DESCRIPTION: Describe your dizziness.     Feels like pressure in top of the head when turning head  2. SEVERITY: How bad is it?  Can you walk?    Yes can walk  3. ONSET:  When did the dizziness begin?     Last couple days  4. AGGRAVATING FACTORS: Does anything make it worse? (e.g., standing, change in head position)     Turning head  5. CAUSE: What do you think is causing the dizziness?     Unsure  6. RECURRENT SYMPTOM: Have you had dizziness before? If Yes, ask: When was the last time? What happened that time?     Had vertigo several years ago.   7. OTHER SYMPTOMS: Do you have any other symptoms? (e.g., earache, headache, numbness, tinnitus, vomiting, weakness)     Denies  Protocols used: Dizziness - Vertigo-A-AH  Copied from [id number]. Topic: Clinical - Red Word Triage >> Jan 13, 2024  9:24 AM Anairis L wrote: [facility] that prompted transfer to Nurse Triage: Dizziness.

## 2024-01-14 ENCOUNTER — Encounter: Payer: Self-pay | Admitting: Family Medicine

## 2024-01-14 ENCOUNTER — Ambulatory Visit: Payer: Self-pay | Admitting: Family Medicine

## 2024-01-14 VITALS — BP 141/81 | HR 83 | Ht 66.0 in | Wt 187.0 lb

## 2024-01-14 DIAGNOSIS — R7303 Prediabetes: Secondary | ICD-10-CM | POA: Diagnosis not present

## 2024-01-14 DIAGNOSIS — R42 Dizziness and giddiness: Secondary | ICD-10-CM | POA: Diagnosis not present

## 2024-01-14 DIAGNOSIS — E785 Hyperlipidemia, unspecified: Secondary | ICD-10-CM | POA: Diagnosis not present

## 2024-01-14 DIAGNOSIS — Z125 Encounter for screening for malignant neoplasm of prostate: Secondary | ICD-10-CM | POA: Diagnosis not present

## 2024-01-14 MED ORDER — MECLIZINE HCL 25 MG PO TABS: 25.0000 mg | ORAL_TABLET | Freq: Three times a day (TID) | ORAL | 0 refills | Status: AC | PRN

## 2024-01-14 NOTE — Assessment & Plan Note (Signed)
 Patient's history is consistent with BPPV.  Meclizine as directed.  Labs today.

## 2024-01-14 NOTE — Progress Notes (Signed)
 Subjective:  Patient ID: Kyle Lane, male    DOB: 1960/09/01  Age: 63 y.o. MRN: [id number]  CC:   Chief Complaint  Patient presents with   Dizziness    Dizzy and light headed, happened yesterday at work, and lasted for hours     HPI:  63 year old male presents for evaluation of dizziness.  Patient was at work yesterday and developed sudden onset dizziness with abrupt movements.  He states it lasted for a while and this concerned him.  Dizziness has improved currently.  However, he still does not feel like he is back to 100%.  BP is decently controlled.  He is compliant with his medication.  Patient Active Problem List   Diagnosis Date Noted   Dizziness 01/14/2024   Prediabetes 06/07/2021   Tubular adenoma 03/13/2020   Essential hypertension, benign 12/09/2012   Hyperlipidemia 12/09/2012   GERD 11/17/2008    Social Hx   Social History   Socioeconomic History   Marital status: Married    Spouse name: Not on file   Number of children: Not on file   Years of education: Not on file   Highest education level: 12th grade  Occupational History   Not on file  Tobacco Use   Smoking status: Never   Smokeless tobacco: Never  Substance and Sexual Activity   Alcohol use: Yes    Comment: 1 beer per day   Drug use: Never   Sexual activity: Not on file  Other Topics Concern   Not on file  Social History Narrative   Not on file   Social Drivers of Health   Financial Resource Strain: Low Risk  (10/23/2022)   Overall Financial Resource Strain (CARDIA)    Difficulty of Paying Living Expenses: Not hard at all  Food Insecurity: No Food Insecurity (10/23/2022)   Hunger Vital Sign    Worried About Running Out of Food in the Last Year: Never true    Ran Out of Food in the Last Year: Never true  Transportation Needs: No Transportation Needs (10/23/2022)   PRAPARE - Administrator, Civil Service (Medical): No    Lack of Transportation (Non-Medical): No  Physical  Activity: Insufficiently Active (10/23/2022)   Exercise Vital Sign    Days of Exercise per Week: 3 days    Minutes of Exercise per Session: 30 min  Stress: No Stress Concern Present (10/23/2022)   [employer] of Occupational Health - Occupational Stress Questionnaire    Feeling of Stress : Not at all  Social Connections: Moderately Integrated (10/23/2022)   Social Connection and Isolation Panel    Frequency of Communication with Friends and Family: More than three times a week    Frequency of Social Gatherings with Friends and Family: More than three times a week    Attends Religious Services: 1 to 4 times per year    Active Member of [employer] or Organizations: No    Attends Engineer, Structural: Not on file    Marital Status: Married    Review of Systems Per HPI  Objective:  BP (!) 141/81   Pulse 83   Ht 5' 6 (1.676 m)   Wt 187 lb (84.8 kg)   SpO2 97%   BMI 30.18 kg/m      01/14/2024    8:44 AM 10/23/2022   10:14 AM 09/11/2022   10:58 AM  BP/Weight  Systolic BP 141 127 151  Diastolic BP 81 79 91  Wt. (Lbs) 187 188.6  BMI 30.18 kg/m2 30.44 kg/m2     Physical Exam Vitals and nursing note reviewed.  Constitutional:      General: He is not in acute distress.    Appearance: Normal appearance.  HENT:     Head: Normocephalic and atraumatic.     Right Ear: Tympanic membrane normal.     Left Ear: Tympanic membrane normal.  Cardiovascular:     Rate and Rhythm: Normal rate and regular rhythm.  Pulmonary:     Effort: Pulmonary effort is normal.     Breath sounds: No wheezing, rhonchi or rales.  Neurological:     General: No focal deficit present.     Mental Status: He is alert.  Psychiatric:        Mood and Affect: Mood normal.        Behavior: Behavior normal.     Lab Results  Component Value Date   WBC 8.6 09/11/2022   HGB 15.1 09/11/2022   HCT 44.1 09/11/2022   PLT 256 09/11/2022   GLUCOSE 103 (H) 09/11/2022   CHOL 173 09/11/2022   TRIG 99  09/11/2022   HDL 56 09/11/2022   LDLCALC 99 09/11/2022   ALT 19 09/11/2022   AST 21 09/11/2022   NA 143 09/11/2022   K 4.7 09/11/2022   CL 103 09/11/2022   CREATININE 1.09 09/11/2022   BUN 13 09/11/2022   CO2 24 09/11/2022   PSA 0.47 07/23/2013   HGBA1C 5.9 (H) 09/11/2022     Assessment & Plan:  Dizziness Assessment & Plan: Patient's history is consistent with BPPV.  Meclizine as directed.  Labs today.  Orders: -     CBC  Prediabetes -     CMP14+EGFR -     Hemoglobin A1c -     Microalbumin / creatinine urine ratio  Hyperlipidemia, unspecified hyperlipidemia type -     Lipid panel  Prostate cancer screening -     PSA  Other orders -     Meclizine HCl; Take 1 tablet (25 mg total) by mouth 3 (three) times daily as needed for dizziness.  Dispense: 30 tablet; Refill: 0    Follow-up:  6 months  Maybree Riling Bluford DO [facility]

## 2024-01-14 NOTE — Patient Instructions (Signed)
 Medication as needed.  Make sure you stay well-hydrated.  Labs today.  Follow-up in 6 months.

## 2024-01-16 ENCOUNTER — Ambulatory Visit: Payer: Self-pay | Admitting: Family Medicine

## 2024-01-16 LAB — LIPID PANEL
Chol/HDL Ratio: 3.4 ratio (ref 0.0–5.0)
Cholesterol, Total: 189 mg/dL (ref 100–199)
HDL: 56 mg/dL (ref >39)
LDL Chol Calc (NIH): 109 mg/dL — ABNORMAL HIGH (ref 0–99)
Triglycerides: 135 mg/dL (ref 0–149)
VLDL Cholesterol Cal: 24 mg/dL (ref 5–40)

## 2024-01-16 LAB — CBC
Hematocrit: 46.8 % (ref 37.5–51.0)
Hemoglobin: 15.3 g/dL (ref 13.0–17.7)
MCH: 33.3 pg — ABNORMAL HIGH (ref 26.6–33.0)
MCHC: 32.7 g/dL (ref 31.5–35.7)
MCV: 102 fL — ABNORMAL HIGH (ref 79–97)
Platelets: 263 x10E3/uL (ref 150–450)
RBC: 4.6 x10E6/uL (ref 4.14–5.80)
RDW: 12.2 % (ref 11.6–15.4)
WBC: 8.6 x10E3/uL (ref 3.4–10.8)

## 2024-01-16 LAB — HEMOGLOBIN A1C
Est. average glucose Bld gHb Est-mCnc: 123 mg/dL
Hgb A1c MFr Bld: 5.9 % — ABNORMAL HIGH (ref 4.8–5.6)

## 2024-01-16 LAB — CMP14+EGFR
ALT: 17 IU/L (ref 0–44)
AST: 16 IU/L (ref 0–40)
Albumin: 4.4 g/dL (ref 3.9–4.9)
Alkaline Phosphatase: 71 IU/L (ref 47–123)
BUN/Creatinine Ratio: 17 (ref 10–24)
BUN: 17 mg/dL (ref 8–27)
Bilirubin Total: 0.5 mg/dL (ref 0.0–1.2)
CO2: 23 mmol/L (ref 20–29)
Calcium: 9.7 mg/dL (ref 8.6–10.2)
Chloride: 100 mmol/L (ref 96–106)
Creatinine, Ser: 1.03 mg/dL (ref 0.76–1.27)
Globulin, Total: 2.6 g/dL (ref 1.5–4.5)
Glucose: 126 mg/dL — ABNORMAL HIGH (ref 70–99)
Potassium: 4.1 mmol/L (ref 3.5–5.2)
Sodium: 139 mmol/L (ref 134–144)
Total Protein: 7 g/dL (ref 6.0–8.5)
eGFR: 81 mL/min/1.73 (ref >59)

## 2024-01-16 LAB — MICROALBUMIN / CREATININE URINE RATIO
Creatinine, Urine: 92 mg/dL
Microalb/Creat Ratio: <3 mg/g{creat} (ref 0–29)
Microalbumin, Urine: <3 ug/mL

## 2024-01-16 LAB — PSA: Prostate Specific Ag, Serum: 0.5 ng/mL (ref 0.0–4.0)
